# Patient Record
Sex: Female | Born: 1990 | Race: Black or African American | Hispanic: No | Marital: Single | Smoking: Never smoker
Health system: Southern US, Community
[De-identification: ages and names within clinical notes are randomized; demographics above are authoritative.]

---

## 2012-12-07 ENCOUNTER — Emergency Department (HOSPITAL_COMMUNITY)
Admission: EM | Admit: 2012-12-07 | Discharge: 2012-12-07 | Disposition: A | Discharge status: Home / Self Care | Payer: PRIVATE HEALTH INSURANCE | Attending: Emergency Medicine | Admitting: Emergency Medicine

## 2012-12-07 ENCOUNTER — Encounter (HOSPITAL_COMMUNITY): Payer: Self-pay | Admitting: Nurse Practitioner

## 2012-12-07 DIAGNOSIS — B3731 Acute candidiasis of vulva and vagina: Secondary | ICD-10-CM | POA: Insufficient documentation

## 2012-12-07 DIAGNOSIS — N898 Other specified noninflammatory disorders of vagina: Secondary | ICD-10-CM | POA: Insufficient documentation

## 2012-12-07 DIAGNOSIS — B373 Candidiasis of vulva and vagina: Secondary | ICD-10-CM

## 2012-12-07 LAB — WET PREP, GENITAL
Clue Cells Wet Prep HPF POC: NONE SEEN
Trich, Wet Prep: NONE SEEN

## 2012-12-07 MED ORDER — FLUCONAZOLE 150 MG PO TABS: 150.0000 mg | ORAL_TABLET | Freq: Once | ORAL | Status: DC

## 2012-12-07 NOTE — ED Provider Notes (Signed)
CSN: [id number]     Arrival date & time 12/07/12  1855 History   First MD Initiated Contact with Patient 12/07/12 1912     Chief Complaint  Patient presents with  . Vaginal Itching   (Consider location/radiation/quality/duration/timing/severity/associated sxs/prior Treatment) Patient is a 22 y.o. female presenting with vaginal itching. The history is provided by the patient and medical records. No language interpreter was used.  Vaginal Itching Pertinent negatives include no abdominal pain, chest pain, coughing, diaphoresis, fatigue, fever, headaches, nausea, rash or vomiting.    Kiara Shaw is a 22 y.o. female  with no medical history presents to the Emergency Department complaining of gradual, persistent, progressively worsening vaginal itching onset several weeks ago. 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 She reports itching of the labia without vaginal discharge.  She reports that the skin around her vagina burns when she takes a shower.   She denies history of sexual activity, STD.  Associated symptoms include severe itching.  Patient has not tried any over-the-counter medications. Nothing makes it better and scratching and showers makes it worse.  Pt denies fever, chills, headache neck pain, chest pain or shortness of breath no abdominal pain nausea, vomiting, diarrhea, weakness and dizziness, dysuria, hematuria, vaginal discharge, vaginal bleeding.     History reviewed. No pertinent past medical history. History reviewed. No pertinent past surgical history. History reviewed. No pertinent family history. History  Substance Use Topics  . Smoking status: Never Smoker   . Smokeless tobacco: Not on file  . Alcohol Use: No   OB History   Grav Para Term Preterm Abortions TAB SAB Ect Mult Living                 Review of Systems  Constitutional: Negative for fever, diaphoresis, appetite change, fatigue and unexpected weight  change.  HENT: Negative for mouth sores and neck stiffness.   Eyes: Negative for visual disturbance.  Respiratory: Negative for cough, chest tightness, shortness of breath and wheezing.   Cardiovascular: Negative for chest pain.  Gastrointestinal: Negative for nausea, vomiting, abdominal pain, diarrhea and constipation.  Endocrine: Negative for polydipsia, polyphagia and polyuria.  Genitourinary: Negative for dysuria, urgency, frequency and hematuria.       Vaginal itching  Musculoskeletal: Negative for back pain.  Skin: Negative for rash.  Allergic/Immunologic: Negative for immunocompromised state.  Neurological: Negative for syncope, light-headedness and headaches.  Hematological: Does not bruise/bleed easily.  Psychiatric/Behavioral: Negative for sleep disturbance. The patient is not nervous/anxious.     Allergies  Cefotaxime  Home Medications   Current Outpatient Rx  Name  Route  Sig  Dispense  Refill  . fluconazole (DIFLUCAN) 150 MG tablet   Oral   Take 1 tablet (150 mg total) by mouth once.   1 tablet   0    BP 122/77  Pulse 96  Temp(Src) 98.4 F (36.9 C) (Oral)  Resp 16  SpO2 96% Physical Exam  Nursing note and vitals reviewed. Constitutional: She appears well-developed and well-nourished. No distress.  Awake, alert, nontoxic appearance  HENT:  Head: Normocephalic and atraumatic.  Mouth/Throat: Oropharynx is clear and moist. No oropharyngeal exudate.  Eyes: Conjunctivae are normal. No scleral icterus.  Neck: Normal range of motion. Neck supple.  Cardiovascular: Normal rate, regular rhythm, normal heart sounds and intact distal pulses.   No murmur heard. Pulmonary/Chest: Effort normal and breath sounds normal. No respiratory distress. She has no wheezes.  Abdominal: Soft. Bowel sounds are normal.  She exhibits no distension. There is no hepatosplenomegaly. There is no tenderness. There is no rebound and no CVA tenderness. Hernia confirmed negative in the right  inguinal area and confirmed negative in the left inguinal area.  Soft and nontender  Genitourinary: Uterus normal. Pelvic exam was performed with patient supine. There is lesion on the right labia. There is no rash, tenderness or injury on the right labia. There is lesion on the left labia. There is no rash, tenderness or injury on the left labia. Uterus is not tender. Cervix exhibits no motion tenderness and no friability. Right adnexum displays no mass and no tenderness. Left adnexum displays no mass and no tenderness. No erythema, tenderness or bleeding around the vagina. No foreign body around the vagina. No signs of injury around the vagina. Vaginal discharge ( White, thick, copious) found.  Excoriations to the labia bilaterally; mild erythema without induration or fluctuance; no evidence of abscess Copious thick vaginal discharge  Musculoskeletal: Normal range of motion. She exhibits no edema.  Lymphadenopathy:    She has no cervical adenopathy.       Right: No inguinal adenopathy present.       Left: No inguinal adenopathy present.  Neurological: She is alert.  Speech is clear and goal oriented Moves extremities without ataxia  Skin: Skin is warm and dry. No rash noted. She is not diaphoretic.  Psychiatric: She has a normal mood and affect. Her behavior is normal.    ED Course  Procedures (including critical care time) Labs Review Labs Reviewed  WET PREP, GENITAL - Abnormal; Notable for the following:    Yeast Wet Prep HPF POC FEW (*)    WBC, Wet Prep HPF POC FEW (*)    All other components within normal limits   Imaging Review No results found.  MDM   1. Vaginal yeast infection      Kiara Shaw presents with vaginal itching and discharge on physical exam.  History and physical consistent with Candida infection. Will obtain wet prep. No dysuria, suprapubic abdominal pain, hematuria or evidence of urinary tract infection.  8:41 PM Wet prep with yeast; pt with candidal  vulvovaginitis.  Will d/c with Rx for diflucan and recommend f/u with ob/gyn for further evaluation.    It has been determined that no acute conditions requiring further emergency intervention are present at this time. The patient/guardian have been advised of the diagnosis and plan. We have discussed signs and symptoms that warrant return to the ED, such as changes or worsening in symptoms.   Vital signs are stable at discharge.   BP 122/77  Pulse 96  Temp(Src) 98.4 F (36.9 C) (Oral)  Resp 16  SpO2 96%  Patient/guardian has voiced understanding and agreed to follow-up with the PCP or specialist.         Dierdre Forth, PA-C 12/07/12 2043

## 2012-12-07 NOTE — ED Notes (Signed)
Pt reports vaginal itching and pain since Thursday. No vaginal discharge. No abd cramping, bowel/bladder changes. Hurts to take a shower

## 2012-12-08 NOTE — ED Provider Notes (Signed)
Medical screening examination/treatment/procedure(s) were performed by non-physician practitioner and as supervising physician I was immediately available for consultation/collaboration.   Thresa Dozier W. Journey Castonguay, MD 12/08/12 1225 

## 2013-04-03 ENCOUNTER — Emergency Department (HOSPITAL_COMMUNITY)
Admission: EM | Admit: 2013-04-03 | Discharge: 2013-04-03 | Disposition: A | Discharge status: Home / Self Care | Payer: PRIVATE HEALTH INSURANCE | Attending: Emergency Medicine | Admitting: Emergency Medicine

## 2013-04-03 ENCOUNTER — Encounter (HOSPITAL_COMMUNITY): Payer: Self-pay | Admitting: Emergency Medicine

## 2013-04-03 DIAGNOSIS — H6120 Impacted cerumen, unspecified ear: Secondary | ICD-10-CM | POA: Insufficient documentation

## 2013-04-03 DIAGNOSIS — Z881 Allergy status to other antibiotic agents status: Secondary | ICD-10-CM | POA: Insufficient documentation

## 2013-04-03 DIAGNOSIS — H6690 Otitis media, unspecified, unspecified ear: Secondary | ICD-10-CM | POA: Insufficient documentation

## 2013-04-03 DIAGNOSIS — H6691 Otitis media, unspecified, right ear: Secondary | ICD-10-CM

## 2013-04-03 DIAGNOSIS — H9209 Otalgia, unspecified ear: Secondary | ICD-10-CM

## 2013-04-03 MED ORDER — ERYTHROMYCIN BASE 250 MG PO TABS: 500.0000 mg | ORAL_TABLET | Freq: Two times a day (BID) | ORAL | Status: DC

## 2013-04-03 MED ORDER — AMOXICILLIN 500 MG PO CAPS: 500.0000 mg | ORAL_CAPSULE | Freq: Two times a day (BID) | ORAL | Status: DC

## 2013-04-03 NOTE — ED Provider Notes (Addendum)
CSN: [id number]     Arrival date & time 04/03/13  1459 History   This chart was scribed for non-physician practitioner Antony MaduraKelly Burdett Pinzon, PA-C working with Hurman HornJohn M Bednar, MD by Donne Anonayla Curran, ED Scribe. This patient was seen in room TR07C/TR07C and the patient's care was started at 1645.   First MD Initiated Contact with Patient 04/03/13 1645     Chief Complaint  Patient presents with  . Otalgia    The history is provided by the patient. No language interpreter was used.   HPI Comments: Kiara Shaw is a 23 y.o. female who presents to the Emergency Department complaining of 3 days of gradual onset, constant, non changing, moderate right sided otalgia described as aching. She denies ear drainage, swelling of the outer ear, decreased hearing, fever, nasal congestion, rhinorrhea, nasal congestion, inability to swallow, drooling, neck pain, neck stiffness or any other symptoms. She denies a hx of chronic ear infections and has never had tubes in her ears.   History reviewed. No pertinent past medical history. History reviewed. No pertinent past surgical history. No family history on file. History  Substance Use Topics  . Smoking status: Never Smoker   . Smokeless tobacco: Not on file  . Alcohol Use: No   OB History   Grav Para Term Preterm Abortions TAB SAB Ect Mult Living                 Review of Systems  Constitutional: Negative for fever.  HENT: Positive for ear pain. Negative for congestion, drooling, ear discharge, hearing loss, rhinorrhea and trouble swallowing.   Musculoskeletal: Negative for neck pain and neck stiffness.  All other systems reviewed and are negative.    Allergies  Cefotaxime  Home Medications   Current Outpatient Rx  Name  Route  Sig  Dispense  Refill  . amoxicillin (AMOXIL) 500 MG capsule   Oral   Take 1 capsule (500 mg total) by mouth 2 (two) times daily.   20 capsule   0     BP 130/80  Pulse 80  Temp(Src) 98.8 F (37.1 C) (Oral)  Resp 18   Wt 225 lb (102.059 kg)  SpO2 98%  LMP 03/15/2013  Physical Exam  Nursing note and vitals reviewed. Constitutional: She is oriented to person, place, and time. She appears well-developed and well-nourished. No distress.  HENT:  Head: Normocephalic and atraumatic.  Right Ear: Hearing and external ear normal. No drainage or tenderness. No mastoid tenderness. No decreased hearing is noted.  Left Ear: Hearing, tympanic membrane, external ear and ear canal normal. No drainage. No mastoid tenderness. No decreased hearing is noted.  Nose: Nose normal.  Mouth/Throat: Uvula is midline, oropharynx is clear and moist and mucous membranes are normal.  +cerumen impaction of R ear; unable to visualize posterior canal and TM.   Eyes: Conjunctivae and EOM are normal. No scleral icterus.  Neck: Normal range of motion.  Pulmonary/Chest: Effort normal. No respiratory distress.  Musculoskeletal: Normal range of motion.  Neurological: She is alert and oriented to person, place, and time.  Skin: Skin is warm and dry. No rash noted. She is not diaphoretic. No erythema. No pallor.  Psychiatric: She has a normal mood and affect. Her behavior is normal.    ED Course  FOREIGN BODY REMOVAL Date/Time: 05/07/2013 7:25 AM Performed by: Antony MaduraHUMES, Reeder Brisby Authorized by: Antony MaduraHUMES, Cali Cuartas Consent: Verbal consent obtained. written consent not obtained. The procedure was performed in an emergent situation. Risks and benefits: risks, benefits and alternatives  were discussed Consent given by: patient Patient understanding: patient states understanding of the procedure being performed Patient consent: the patient's understanding of the procedure matches consent given Procedure consent: procedure consent matches procedure scheduled Relevant documents: relevant documents present and verified Test results: test results available and properly labeled Site marked: the operative site was marked Imaging studies: imaging studies  available Required items: required blood products, implants, devices, and special equipment available Patient identity confirmed: verbally with patient and arm band Time out: Immediately prior to procedure a "time out" was called to verify the correct patient, procedure, equipment, support staff and site/side marked as required. Body area: ear Location details: right ear Patient sedated: no Patient restrained: no Patient cooperative: yes Localization method: visualized Removal mechanism: irrigation Complexity: simple 1 objects recovered. Objects recovered: cerumen Post-procedure assessment: foreign body removed Patient tolerance: Patient tolerated the procedure well with no immediate complications.   (including critical care time) DIAGNOSTIC STUDIES: Oxygen Saturation is 98% on RA, normal by my interpretation.    COORDINATION OF CARE: 4:55 PM Discussed treatment plan which includes water flush of right ear with pt at bedside and pt agreed to plan.   6:17 PM Rechecked pt. Visualized TM. Will discharge home with antibiotic. Advised pt to follow up with PCP.   Labs Review Labs Reviewed - No data to display Imaging Review No results found.  EKG Interpretation   None       MDM   1. Otitis media of right ear   2. Otalgia    23 year old female presents for right-sided otalgia. Physical exam without any decreased hearing or mastoid swelling, erythema, or tenderness bilaterally. No nuchal rigidity or meningismus. No trismus. Patient found to have impacted cerumen in right ear canal. Cerumen successfully removed and ear reexamined. Posterior ear canal erythematous with erythematous and dull tympanic membrane. No bulging, retraction, or perforation. Will cover patient for otitis media with course of oral antibiotics. Return precautions discussed and patient agreeable to plan with no unaddressed concerns.  I personally performed the services described in this documentation, which was  scribed in my presence. The recorded information has been reviewed and is accurate.     Antony Madura, PA-C 04/03/13 1830  Antony Madura, PA-C 05/07/13 [phone]

## 2013-04-03 NOTE — Discharge Instructions (Signed)
Take the antibiotics as prescribed to for the full course. Take ibuprofen 600 mg every 6 hours as needed for discomfort. Follow up with your primary care doctor. Return if symptoms worsen.  Otitis Media, Adult Otitis media is redness, soreness, and swelling (inflammation) of the middle ear. Otitis media may be caused by allergies or, most commonly, by infection. Often it occurs as a complication of the common cold. SIGNS AND SYMPTOMS Symptoms of otitis media may include:  Earache.  Fever.  Ringing in your ear.  Headache.  Leakage of fluid from the ear. DIAGNOSIS To diagnose otitis media, your health care provider will examine your ear with an otoscope. This is an instrument that allows your health care provider to see into your ear in order to examine your eardrum. Your health care provider also will ask you questions about your symptoms. TREATMENT  Typically, otitis media resolves on its own within 3 5 days. Your health care provider may prescribe medicine to ease your symptoms of pain. If otitis media does not resolve within 5 days or is recurrent, your health care provider may prescribe antibiotic medicines if he or she suspects that a bacterial infection is the cause. HOME CARE INSTRUCTIONS   Take your medicine as directed until it is gone, even if you feel better after the first few days.  Only take over-the-counter or prescription medicines for pain, discomfort, or fever as directed by your health care provider.  Follow up with your health care provider as directed. SEEK MEDICAL CARE IF:  You have otitis media only in one ear or bleeding from your nose or both.  You notice a lump on your neck.  You are not getting better in 3 5 days.  You feel worse instead of better. SEEK IMMEDIATE MEDICAL CARE IF:   You have pain that is not controlled with medicine.  You have swelling, redness, or pain around your ear or stiffness in your neck.  You notice that part of your face is  paralyzed.  You notice that the bone behind your ear (mastoid) is tender when you touch it. MAKE SURE YOU:   Understand these instructions.  Will watch your condition.  Will get help right away if you are not doing well or get worse. Document Released: 12/06/2003 Document Revised: 12/21/2012 Document Reviewed: 09/27/2012 [facility] Patient Information 2014 [place], [place].

## 2013-04-03 NOTE — ED Notes (Signed)
Pt is here with right ear pain

## 2013-04-03 NOTE — ED Notes (Signed)
Ear waxed removed and uneventfull.

## 2013-04-03 NOTE — ED Notes (Signed)
Pt discharged.Vital signs stable and GCS 15 

## 2013-04-06 NOTE — ED Provider Notes (Signed)
Medical screening examination/treatment/procedure(s) were performed by non-physician practitioner and as supervising physician I was immediately available for consultation/collaboration.  Theresa Wedel M Neftali Abair, MD 04/06/13 1854 

## 2013-05-08 NOTE — ED Provider Notes (Signed)
Medical screening examination/treatment/procedure(s) were performed by non-physician practitioner and as supervising physician I was immediately available for consultation/collaboration.  Hurman HornJohn M Izabellah Dadisman, MD 05/08/13 [phone]

## 2013-06-11 ENCOUNTER — Emergency Department (HOSPITAL_COMMUNITY)
Admission: EM | Admit: 2013-06-11 | Discharge: 2013-06-11 | Disposition: A | Discharge status: Home / Self Care | Payer: PRIVATE HEALTH INSURANCE | Attending: Emergency Medicine | Admitting: Emergency Medicine

## 2013-06-11 ENCOUNTER — Encounter (HOSPITAL_COMMUNITY): Payer: Self-pay | Admitting: Emergency Medicine

## 2013-06-11 DIAGNOSIS — Z792 Long term (current) use of antibiotics: Secondary | ICD-10-CM | POA: Insufficient documentation

## 2013-06-11 DIAGNOSIS — B360 Pityriasis versicolor: Secondary | ICD-10-CM | POA: Insufficient documentation

## 2013-06-11 MED ORDER — CLOTRIMAZOLE-BETAMETHASONE 1-0.05 % EX CREA: 1.0000 "application " | TOPICAL_CREAM | Freq: Two times a day (BID) | CUTANEOUS | Status: DC

## 2013-06-11 NOTE — ED Provider Notes (Signed)
CSN: [id number]     Arrival date & time 06/11/13  1542 History  This chart was scribed for non-physician practitioner, Dierdre Forth, PA-C,working with Geoffery Lyons, MD, by Karle Plumber, ED Scribe.  This patient was seen in room TR04C/TR04C and the patient's care was started at 4:07 PM.  Chief Complaint  Patient presents with  . Rash   The history is provided by the patient and medical records. No language interpreter was used.   HPI Comments:  Kiara Shaw is a 23 y.o. female who presents to the Emergency Department complaining of an itchy rash that started approximately two weeks ago. She states the rash starts behind her ears and goes across under her chin, extending down to her chest. She reports the rash has also extended down on to her lateral left-sided torso and her upper left shoulder towards the back. She denies any rash on her arms or legs. She denies any new creams, lotions, cosmetics or detergents. She denies any recent travel. She reports using OTC hydrocortisone creams that have relieved the itch. She denies taking anything orally for her symptoms. She denies fever, chills, nausea, vomiting, or swelling.    History reviewed. No pertinent past medical history. History reviewed. No pertinent past surgical history. History reviewed. No pertinent family history. History  Substance Use Topics  . Smoking status: Never Smoker   . Smokeless tobacco: Not on file  . Alcohol Use: No   OB History   Grav Para Term Preterm Abortions TAB SAB Ect Mult Living                 Review of Systems  Constitutional: Negative for fever and chills.  Eyes: Negative for visual disturbance.  Respiratory: Negative for cough.   Gastrointestinal: Negative for nausea and vomiting.  Endocrine: Negative for polydipsia, polyphagia and polyuria.  Skin: Positive for rash.  Allergic/Immunologic: Negative for immunocompromised state.  Neurological: Negative for headaches.  Hematological: Does  not bruise/bleed easily.    Allergies  Cefotaxime  Home Medications   Current Outpatient Rx  Name  Route  Sig  Dispense  Refill  . clotrimazole-betamethasone (LOTRISONE) cream   Topical   Apply 1 application topically 2 (two) times daily.   30 g   0   . erythromycin (E-MYCIN) 250 MG tablet   Oral   Take 2 tablets (500 mg total) by mouth 2 (two) times daily.   40 tablet   0    Triage Vitals: BP 141/80  Pulse 100  Temp(Src) 98.4 F (36.9 C) (Oral)  Resp 18  SpO2 98%  LMP 05/13/2013 Physical Exam  Nursing note and vitals reviewed. Constitutional: She appears well-developed and well-nourished. No distress.  Awake, alert, nontoxic appearance  HENT:  Head: Normocephalic and atraumatic.  Mouth/Throat: Oropharynx is clear and moist. No oropharyngeal exudate, posterior oropharyngeal edema or posterior oropharyngeal erythema.  Eyes: Conjunctivae are normal. No scleral icterus.  Neck: Normal range of motion. Neck supple.  Cardiovascular: Normal rate, regular rhythm, normal heart sounds and intact distal pulses.   Pulmonary/Chest: Effort normal and breath sounds normal.  Clear and equal without wheezing  Musculoskeletal: Normal range of motion. She exhibits no edema.  Neurological: She is alert.  Speech is clear and goal oriented Moves extremities without ataxia  Skin: Skin is warm and dry. Rash noted. She is not diaphoretic.  Oval, erythematous scaling patches along anteror and bilateral neck, right anterior chest, left posterior shoulder and right flank. No Christmas tree pattern on back. No patches on  arms or legs. Rash is mildly excoriated. No petechiae or purpura.  Psychiatric: She has a normal mood and affect.    ED Course  Procedures (including critical care time) DIAGNOSTIC STUDIES: Oxygen Saturation is 98% on RA, normal by my interpretation.   COORDINATION OF CARE: 4:12 PM- Will refer to dermatology. Pt verbalizes understanding and agrees to plan.  Medications  - No data to display  Labs Review Labs Reviewed - No data to display Imaging Review No results found.   EKG Interpretation None      MDM   Final diagnoses:  Tinea versicolor   Kiara Shaw presents with 2 weeks of rash.  NO urticaria or hive to indicate allergic reaction.  Oropharynx clear and no wheezing.  Rash likely fungal in nature.  No petechiae or purpura to suggest systemic disease.  Afebrile, non tachycardic and nonseptic appearing. Will give lotrisone and have her follow-up with dermatology.  It has been determined that no acute conditions requiring further emergency intervention are present at this time. The patient/guardian have been advised of the diagnosis and plan. We have discussed signs and symptoms that warrant return to the ED, such as changes or worsening in symptoms.   Vital signs are stable at discharge.   BP 141/80  Pulse 100  Temp(Src) 98.4 F (36.9 C) (Oral)  Resp 18  SpO2 98%  LMP 05/13/2013  Patient/guardian has voiced understanding and agreed to follow-up with the PCP or specialist.     I personally performed the services described in this documentation, which was scribed in my presence. The recorded information has been reviewed and is accurate.    Dahlia ClientHannah Ximenna Fonseca, PA-C 06/11/13 [phone]

## 2013-06-11 NOTE — Discharge Instructions (Signed)
1. Medications: lotrisone, usual home medications 2. Treatment: rest, drink plenty of fluids, keep clean and dry 3. Follow Up: Please followup with your primary doctor for discussion of your diagnoses and further evaluation after today's visit; if you do not have a primary care doctor use the resource guide provided to find one; follow-up with dermatology    Tinea Versicolor Tinea versicolor is a common yeast infection of the skin. This condition becomes known when the yeast on our skin starts to overgrow (yeast is a normal inhabitant on our skin). This condition is noticed as white or light brown patches on brown skin, and is more evident in the summer on tanned skin. These areas are slightly scaly if scratched. The light patches from the yeast become evident when the yeast creates "holes in your suntan". This is most often noticed in the summer. The patches are usually located on the chest, back, pubis, neck and body folds. However, it may occur on any area of body. Mild itching and inflammation (redness or soreness) may be present. DIAGNOSIS  The diagnosisof this is made clinically (by looking). Cultures from samples are usually not needed. Examination under the microscope may help. However, yeast is normally found on skin. The diagnosis still remains clinical. Examination under Wood's Ultraviolet Light can determine the extent of the infection. TREATMENT  This common infection is usually only of cosmetic (only a concern to your appearance). It is easily treated with dandruff shampoo used during showers or bathing. Vigorous scrubbing will eliminate the yeast over several days time. The light areas in your skin may remain for weeks or months after the infection is cured unless your skin is exposed to sunlight. The lighter or darker spots caused by the fungus that remain after complete treatment are not a sign of treatment failure; it will take a long time to resolve. Your caregiver may recommend a  number of commercial preparations or medication by mouth if home care is not working. Recurrence is common and preventative medication may be necessary. This skin condition is not highly contagious. Special care is not needed to protect close friends and family members. Normal hygiene is usually enough. Follow up is required only if you develop complications (such as a secondary infection from scratching), if recommended by your caregiver, or if no relief is obtained from the preparations used. Document Released: 02/28/2000 Document Revised: 05/25/2011 Document Reviewed: 04/11/2008 [facility] Patient Information 2014 [place], [place].    Emergency Department Resource Guide 1) Find a Doctor and Pay Out of Pocket Although you won't have to find out who is covered by your insurance plan, it is a good idea to ask around and get recommendations. You will then need to call the office and see if the doctor you have chosen will accept you as a new patient and what types of options they offer for patients who are self-pay. Some doctors offer discounts or will set up payment plans for their patients who do not have insurance, but you will need to ask so you aren't surprised when you get to your appointment.  2) Contact Your Local Health Department Not all health departments have doctors that can see patients for sick visits, but many do, so it is worth a call to see if yours does. If you don't know where your local health department is, you can check in your phone book. The CDC also has a tool to help you locate your state's health department, and many state websites also have listings of  all of their local health departments.  3) Find a [facility] If your illness is not likely to be very severe or complicated, you may want to try a walk in clinic. These are popping up all over the country in pharmacies, drugstores, and shopping centers. They're usually staffed by nurse practitioners or physician assistants  that have been trained to treat common illnesses and complaints. They're usually fairly quick and inexpensive. However, if you have serious medical issues or chronic medical problems, these are probably not your best option.  No Primary Care Doctor: - Call Health Connect at  [phone] - they can help you locate a primary care doctor that  accepts your insurance, provides certain services, etc. - Physician Referral Service- [phone]  Chronic Pain Problems: Organization         Address  Phone   Notes  [facility]  [phone] Patients need to be referred by their primary care doctor.   Medication Assistance: Organization         Address  Phone   Notes  [facility] Medication [facility] [address] [place], [place] [address] [phone] --Must be a resident of [facility] -- Must have NO insurance coverage whatsoever (no Medicaid/ Medicare, etc.) -- The pt. MUST have a primary care doctor that directs their care regularly and follows them in the community   MedAssist  [phone]   [employer]  [phone]    Agencies that provide inexpensive medical care: Organization         Address  Phone   Notes  [place]  [phone]   [facility]    [phone]   [facility] [place], [place] [address] [phone]   [place] [address], [place] [phone]   Planned Parenthood    [phone]   [facility]    [phone]   [place] and [place] [address], [place] Phone:  [phone], Fax:  [phone] Hours of Operation:  9 am - 6 pm, M-F.  Also accepts Medicaid/Medicare and self-pay.  [facility] for [place], [address], [place] Phone: [phone], Fax: [phone]. Hours of Operation:  8:30 am - 5:30 pm, M-F.  Also accepts Medicaid  and self-pay.  [facility] [address] [place] Phone: [phone]   [place], [place], [place] [phone], Ext. [phone] Mondays & Thursdays: 7-9 AM.  First 15 patients are seen on a first come, first serve basis.    [place] Providers:  Organization         Address  Phone   Notes  [facility] [address], [place] [phone] Also accepts self-pay patients.  [facility] [address], [place]  [phone]   [place], [address], [place] [phone]   [facility] [address], [place] [phone]   Lucianne Lei [address], [place]   [phone] Only accepts [place] Access [place] patients after they have their name applied to their card.   Self-Pay (no insurance) in [facility]:  Patent attorney   Notes  Sickle Cell Patients, [facility] [place] [phone]   [facility] [place] [phone]   [facility] [place]  [place], [address],  [phone]   [facility]/Dr. Osei-Bonsu  [address], [place] or [address], [place] [phone] Phone number for both [place] and [place] locations is the same.  Urgent Medical and [facility] [address] [place] [phone]   [facility] [address] [place] or [address] [phone]   [facility] [address], [place] [phone], phone; [phone], fax Sees patients 1st and 3rd Saturday of every month.  Must not qualify for public or private insurance (i.e. Medicaid, Medicare, [place] Health Choice, Veterans' Benefits)  Household income  should be no more than 200% of the poverty level The clinic cannot treat you if you are pregnant or think you are pregnant  Sexually transmitted diseases are not treated at the clinic.    Dental Care: Organization         Address  Phone  Notes  [facility] Department of [facility] [place] [phone] Accepts children up to age 69 who are enrolled in [place] or [place]; pregnant women with a Medicaid card; and children who have applied for Medicaid or [place] Health Choice, but were declined, whose parents can pay a reduced fee at time of service.  [facility] Department of [facility]  [address], [place] [phone] Accepts children up to age 54 who are enrolled in [place] or [place]; pregnant women with a Medicaid card; and children who have applied for Medicaid or [place] Health Choice, but were declined, whose parents can pay a reduced fee at time of service.  [facility] Adult [facility] Access PROGRAM  [place] [phone] Patients are seen by appointment only. Walk-ins are not accepted. [place] will see patients 66 years of age and older. Monday - Tuesday (8am-5pm) Most Wednesdays (8:30-5pm) $30 per visit, cash only  [facility] Adult Dental Access PROGRAM  [address], [facility] [phone] Patients are seen by appointment only. Walk-ins are not accepted. [place] will see patients 62 years of age and older. One Wednesday Evening (Monthly: Volunteer Based).  $30 per visit, cash only  [place]  [phone] for adults; Children under age 26, call Graduate Pediatric Dentistry at [phone]. Children aged 100-14, please call [phone] to request a pediatric application.  Dental services are provided in all areas of dental care including fillings, crowns and bridges, complete and partial dentures, implants, gum treatment,  root canals, and extractions. Preventive care is also provided. Treatment is provided to both adults and children. Patients are selected via a lottery and there is often a waiting list.   [facility] [address] [place]  [phone] [email or web address]   [facility] [address] [place], [place] [phone], Ext. [phone] Second and Fourth Thursday of each month, opens at 6:30 AM; Clinic ends at 9 AM.  Patients are seen on a first-come first-served basis, and a limited number are seen during each clinic.   [facility]  [address] Hillard Danker [place], [place] [phone]   Eligibility  Requirements You must have lived in [place], [place], or [place] counties for at least the last three months.   You cannot be eligible for state or federal sponsored [employer], including [employer], [place], or [employer].   You generally cannot be eligible for healthcare insurance through your employer.    How to apply: Eligibility screenings are held every Tuesday and Wednesday afternoon from 1:00 pm until 4:00 pm. You do not need an appointment for the interview!  [facility] [address], [place], [place]   [place]  [place] Department  [address]  [phone]    Behavioral Health Resources in the Community: Intensive Outpatient Programs Organization         Address  Phone  Notes  [place]. [address], [place], [place] [phone]   [facility] Outpatient [address] [place], [place]   ADS: Alcohol & Drug S[facility]cs [address] [place], [place]   [place] [address]  [place], [place] or [phone]   Substance Abuse Resources Organization         Address  Phone  Notes  Alcohol and Drug Services   [phone]   [place]  [phone]   [facility] [place]   Chinita Pester  [phone]   Residential & Outpatient Substance Abuse Program  [phone]   Psychological Services Organization         Address  Phone  Notes  [facility] [place]  [place]  [phone]   [place] [address] [place] or [phone]    Mobile Crisis Teams Organization         Address  Phone  Notes  [facility], Mobile Crisis Care Unit  [phone]   [facility]ive Psychotherapeutic Services  [address] [place], [place]   [place] [address], [place] [phone]    Self-Help/Support Groups Organization         Address  Phone             Notes  [place]. of [place] - variety of support groups  [place] Call for more information  Narcotics Anonymous (NA), [facility] [address], [employer] [place]  2 meetings at this location   Special educational needs teacher         Address  Phone  Notes  ASAP Residential Treatment [place],    [place]  [phone]   [facility]  [address], [place] [address], [place], [place]   [place], [place] [phone] Admissions: 8am-3pm M-F  Incentives Substance [place] [address]    [place], [place] [phone]   [facility] [address] Jadene Pierini [place], [place]   [facility] [address],  [place], [place]   [facility] Programs - Intensive Outpatient [address] Krist[address], [place], [place]   [facility].,  [place], [place] or [phone]   [facility] Services (RTS) [address] [place], [place] Accepts Medicaid  [facility] [address],  [place] [phone] Substance Abuse/Addiction Treatment   [facility] Resources Organization  Address  Phone  Notes  [facility]  [phone]   Angie FavaJulie Brannon, PhD [address], [address] [place], [place]   [phone] or [phone]) [phone]   [facility] [place]   [address] [place], [place] [phone]) [phone]   [facility] [address], [place], [place] [phone] Insurance/Medicaid/sponsorship through [facility]  [facility] Families [address]                                    [place], [place] [phone] Therapy/tele-psych/case  [facility] [address]   [place], [place] [phone]    Dr. Lolly MustacheArfeen  [phone](336) [phone]   [facility] [place]  [employer] [facility] 1) [address], [place] 2) [address], [place] 3)  [address], [place] [phone]  [phone]) [phone]   [facility] Child Abuse Hotline [phone]) [phone] or [phone]) [phone] (After Hours)

## 2013-06-11 NOTE — ED Provider Notes (Signed)
Medical screening examination/treatment/procedure(s) were conducted as a shared visit with non-physician practitioner(s) and myself.  I personally evaluated the patient during the encounter. Patient is a 23 year old female who presents with rash on the back of her head and neck extending under her chin. This is itchy and has been there for approximately 1-2 week. She denies any fevers or chills. She denies any new contacts.  On exam vitals are stable and the patient is afebrile. Head is atraumatic normocephalic neck is supple. Exam of the skin reveals a blotchy, pruritic rash to the neck.   This appears fungal in nature. This will be treated with Lotrisone and when necessary followup.     Geoffery Lyonsouglas Vincente Asbridge, MD 06/11/13 [phone]

## 2013-06-11 NOTE — ED Notes (Signed)
Reports having a rash and itching x 2 weeks to face and left side. No relief with hydrocortisone cream, has not used any benadryl. No acute distress noted.

## 2013-12-29 ENCOUNTER — Encounter (HOSPITAL_COMMUNITY): Payer: Self-pay | Admitting: Emergency Medicine

## 2013-12-29 DIAGNOSIS — R531 Weakness: Secondary | ICD-10-CM | POA: Insufficient documentation

## 2013-12-29 DIAGNOSIS — R42 Dizziness and giddiness: Secondary | ICD-10-CM | POA: Diagnosis not present

## 2013-12-29 DIAGNOSIS — Z3202 Encounter for pregnancy test, result negative: Secondary | ICD-10-CM | POA: Diagnosis not present

## 2013-12-29 DIAGNOSIS — E669 Obesity, unspecified: Secondary | ICD-10-CM | POA: Diagnosis not present

## 2013-12-29 DIAGNOSIS — N939 Abnormal uterine and vaginal bleeding, unspecified: Secondary | ICD-10-CM | POA: Insufficient documentation

## 2013-12-29 LAB — COMPREHENSIVE METABOLIC PANEL
ALK PHOS: 102 U/L (ref 39–117)
ALT: 19 U/L (ref 0–35)
ANION GAP: 11 (ref 5–15)
AST: 25 U/L (ref 0–37)
Albumin: 3.6 g/dL (ref 3.5–5.2)
BUN: 6 mg/dL (ref 6–23)
CHLORIDE: 101 meq/L (ref 96–112)
CO2: 25 meq/L (ref 19–32)
CREATININE: 0.76 mg/dL (ref 0.50–1.10)
Calcium: 9.8 mg/dL (ref 8.4–10.5)
GLUCOSE: 144 mg/dL — AB (ref 70–99)
POTASSIUM: 3.7 meq/L (ref 3.7–5.3)
Sodium: 137 mEq/L (ref 137–147)
Total Protein: 8.1 g/dL (ref 6.0–8.3)

## 2013-12-29 LAB — CBC WITH DIFFERENTIAL/PLATELET
Basophils Absolute: 0 10*3/uL (ref 0.0–0.1)
Basophils Relative: 0 % (ref 0–1)
Eosinophils Absolute: 0.1 10*3/uL (ref 0.0–0.7)
Eosinophils Relative: 2 % (ref 0–5)
HCT: 35.3 % — ABNORMAL LOW (ref 36.0–46.0)
HEMOGLOBIN: 11.4 g/dL — AB (ref 12.0–15.0)
LYMPHS ABS: 1.5 10*3/uL (ref 0.7–4.0)
Lymphocytes Relative: 31 % (ref 12–46)
MCH: 25 pg — ABNORMAL LOW (ref 26.0–34.0)
MCHC: 32.3 g/dL (ref 30.0–36.0)
MCV: 77.4 fL — ABNORMAL LOW (ref 78.0–100.0)
MONOS PCT: 5 % (ref 3–12)
Monocytes Absolute: 0.2 10*3/uL (ref 0.1–1.0)
NEUTROS ABS: 2.9 10*3/uL (ref 1.7–7.7)
Neutrophils Relative %: 62 % (ref 43–77)
Platelets: 338 10*3/uL (ref 150–400)
RBC: 4.56 MIL/uL (ref 3.87–5.11)
RDW: 14.1 % (ref 11.5–15.5)
WBC: 4.7 10*3/uL (ref 4.0–10.5)

## 2013-12-29 LAB — URINE MICROSCOPIC-ADD ON

## 2013-12-29 LAB — URINALYSIS, ROUTINE W REFLEX MICROSCOPIC
Bilirubin Urine: NEGATIVE
Glucose, UA: NEGATIVE mg/dL
Ketones, ur: 15 mg/dL — AB
NITRITE: NEGATIVE
PROTEIN: NEGATIVE mg/dL
Specific Gravity, Urine: 1.031 — ABNORMAL HIGH (ref 1.005–1.030)
UROBILINOGEN UA: 1 mg/dL (ref 0.0–1.0)
pH: 6 (ref 5.0–8.0)

## 2013-12-29 LAB — PREGNANCY, URINE: Preg Test, Ur: NEGATIVE

## 2013-12-29 NOTE — ED Notes (Signed)
The pt last had a period in aug.  This period has lasted for 10 days.  She feels weak and lightheaded with a headache.  lmp now

## 2013-12-30 ENCOUNTER — Emergency Department (HOSPITAL_COMMUNITY)
Admission: EM | Admit: 2013-12-30 | Discharge: 2013-12-30 | Disposition: A | Discharge status: Home / Self Care | Payer: PRIVATE HEALTH INSURANCE | Attending: Emergency Medicine | Admitting: Emergency Medicine

## 2013-12-30 DIAGNOSIS — N939 Abnormal uterine and vaginal bleeding, unspecified: Secondary | ICD-10-CM

## 2013-12-30 LAB — RPR

## 2013-12-30 LAB — HIV ANTIBODY (ROUTINE TESTING W REFLEX): HIV 1&2 Ab, 4th Generation: NONREACTIVE

## 2013-12-30 MED ORDER — NAPROXEN 500 MG PO TABS: 500.0000 mg | ORAL_TABLET | Freq: Two times a day (BID) | ORAL | Status: DC

## 2013-12-30 NOTE — Discharge Instructions (Signed)
Abnormal Uterine Bleeding Abnormal uterine bleeding can affect women at various stages in life, including teenagers, women in their reproductive years, pregnant women, and women who have reached menopause. Several kinds of uterine bleeding are considered abnormal, including:  Bleeding or spotting between periods.   Bleeding after sexual intercourse.   Bleeding that is heavier or more than normal.   Periods that last longer than usual.  Bleeding after menopause.  Many cases of abnormal uterine bleeding are minor and simple to treat, while others are more serious. Any type of abnormal bleeding should be evaluated by your health care provider. Treatment will depend on the cause of the bleeding. HOME CARE INSTRUCTIONS Monitor your condition for any changes. The following actions may help to alleviate any discomfort you are experiencing:  Avoid the use of tampons and douches as directed by your health care provider.  Change your pads frequently. You should get regular pelvic exams and Pap tests. Keep all follow-up appointments for diagnostic tests as directed by your health care provider.  SEEK MEDICAL CARE IF:   Your bleeding lasts more than 1 week.   You feel dizzy at times.  SEEK IMMEDIATE MEDICAL CARE IF:   You pass out.   You are changing pads every 15 to 30 minutes.   You have abdominal pain.  You have a fever.   You become sweaty or weak.   You are passing large blood clots from the vagina.   You start to feel nauseous and vomit. MAKE SURE YOU:   Understand these instructions.  Will watch your condition.  Will get help right away if you are not doing well or get worse. Document Released: 03/02/2005 Document Revised: 03/07/2013 Document Reviewed: 09/29/2012 ExitCare Patient Information 2015 [employer]Care[employer]. This information is not intended to replace advice given to you by your health care provider. Make sure you discuss any questions you have with your  health care provider.  

## 2013-12-30 NOTE — ED Provider Notes (Signed)
CSN: [id number]     Arrival date & time 12/29/13  2229 History   First MD Initiated Contact with Patient 12/30/13 0151     Chief Complaint  Patient presents with  . Vaginal Bleeding    HPI Pt started having vaginal bleeding 11 days ago.  It is unusual for it to last this long.  She started to feel weak and lightheaded so she came to the ED.  The bleeding is heavy.  No fevers or chills.  NO vomiting.  No abdominal pain.  She does feel fatigued and nothing seems to help. History reviewed. No pertinent past medical history. History reviewed. No pertinent past surgical history. No family history on file. History  Substance Use Topics  . Smoking status: Never Smoker   . Smokeless tobacco: Not on file  . Alcohol Use: No   OB History   Grav Para Term Preterm Abortions TAB SAB Ect Mult Living                 Review of Systems  All other systems reviewed and are negative.     Allergies  Cefotaxime  Home Medications   Prior to Admission medications   Not on File   BP 140/82  Pulse 110  Temp(Src) 98.9 F (37.2 C) (Oral)  Resp 20  Ht 5\' 11"  (1.803 m)  Wt 250 lb (113.399 kg)  BMI 34.88 kg/m2  SpO2 99%  LMP 12/29/2013 Physical Exam  Nursing note and vitals reviewed. Constitutional: No distress.  Obese   HENT:  Head: Normocephalic and atraumatic.  Right Ear: External ear normal.  Left Ear: External ear normal.  Eyes: Conjunctivae are normal. Right eye exhibits no discharge. Left eye exhibits no discharge. No scleral icterus.  Neck: Neck supple. No tracheal deviation present.  Cardiovascular: Normal rate, regular rhythm and intact distal pulses.   Pulmonary/Chest: Effort normal and breath sounds normal. No stridor. No respiratory distress. She has no wheezes. She has no rales.  Abdominal: Soft. Bowel sounds are normal. She exhibits no distension. There is no tenderness. There is no rebound and no guarding.  Genitourinary: Cervix exhibits no motion tenderness. Right  adnexum displays no mass. Left adnexum displays mass. There is bleeding around the vagina. No tenderness around the vagina. No foreign body around the vagina. No signs of injury around the vagina.  Few clots in the vaginal vault   Musculoskeletal: She exhibits no edema and no tenderness.  Neurological: She is alert. She has normal strength. No cranial nerve deficit (no facial droop, extraocular movements intact, no slurred speech) or sensory deficit. She exhibits normal muscle tone. She displays no seizure activity. Coordination normal.  Skin: Skin is warm and dry. No rash noted.  Psychiatric: She has a normal mood and affect.    ED Course  Procedures (including critical care time) Labs Review Labs Reviewed  CBC WITH DIFFERENTIAL - Abnormal; Notable for the following:    Hemoglobin 11.4 (*)    HCT 35.3 (*)    MCV 77.4 (*)    MCH 25.0 (*)    All other components within normal limits  COMPREHENSIVE METABOLIC PANEL - Abnormal; Notable for the following:    Glucose, Bld 144 (*)    Total Bilirubin <0.2 (*)    All other components within normal limits  URINALYSIS, ROUTINE W REFLEX MICROSCOPIC - Abnormal; Notable for the following:    Color, Urine RED (*)    APPearance CLOUDY (*)    Specific Gravity, Urine 1.031 (*)  Hgb urine dipstick LARGE (*)    Ketones, ur 15 (*)    Leukocytes, UA TRACE (*)    All other components within normal limits  PREGNANCY, URINE  URINE MICROSCOPIC-ADD ON     MDM   Final diagnoses:  Abnormal uterine bleeding  Recc follow up with an OB GYN.  Cultures sent tonight.    At this time there does not appear to be any evidence of an acute emergency medical condition and the patient appears stable for discharge with appropriate outpatient follow up.     Linwood DibblesJon Mariano Doshi, MD 12/30/13 [phone]

## 2014-01-01 LAB — GC/CHLAMYDIA PROBE AMP
CT PROBE, AMP APTIMA: NEGATIVE
GC PROBE AMP APTIMA: NEGATIVE

## 2015-03-26 ENCOUNTER — Encounter (HOSPITAL_COMMUNITY): Payer: Self-pay | Admitting: Emergency Medicine

## 2015-03-26 DIAGNOSIS — B349 Viral infection, unspecified: Secondary | ICD-10-CM | POA: Diagnosis not present

## 2015-03-26 DIAGNOSIS — R6883 Chills (without fever): Secondary | ICD-10-CM | POA: Diagnosis present

## 2015-03-26 DIAGNOSIS — Z791 Long term (current) use of non-steroidal anti-inflammatories (NSAID): Secondary | ICD-10-CM | POA: Diagnosis not present

## 2015-03-26 LAB — COMPREHENSIVE METABOLIC PANEL
ALBUMIN: 3.5 g/dL (ref 3.5–5.0)
ALK PHOS: 90 U/L (ref 38–126)
ALT: 29 U/L (ref 14–54)
ANION GAP: 8 (ref 5–15)
AST: 34 U/L (ref 15–41)
BUN: <5 mg/dL — ABNORMAL LOW (ref 6–20)
CALCIUM: 9.2 mg/dL (ref 8.9–10.3)
CO2: 27 mmol/L (ref 22–32)
Chloride: 103 mmol/L (ref 101–111)
Creatinine, Ser: 0.84 mg/dL (ref 0.44–1.00)
GFR calc Af Amer: >60 mL/min (ref >60)
GFR calc non Af Amer: >60 mL/min (ref >60)
Glucose, Bld: 117 mg/dL — ABNORMAL HIGH (ref 65–99)
Potassium: 4.2 mmol/L (ref 3.5–5.1)
Sodium: 138 mmol/L (ref 135–145)
Total Bilirubin: 0.3 mg/dL (ref 0.3–1.2)
Total Protein: 7.5 g/dL (ref 6.5–8.1)

## 2015-03-26 LAB — CBC
HEMATOCRIT: 38.7 % (ref 36.0–46.0)
Hemoglobin: 12.1 g/dL (ref 12.0–15.0)
MCH: 23.3 pg — AB (ref 26.0–34.0)
MCHC: 31.3 g/dL (ref 30.0–36.0)
MCV: 74.6 fL — ABNORMAL LOW (ref 78.0–100.0)
Platelets: 312 10*3/uL (ref 150–400)
RBC: 5.19 MIL/uL — ABNORMAL HIGH (ref 3.87–5.11)
RDW: 15.9 % — ABNORMAL HIGH (ref 11.5–15.5)
WBC: 4.4 10*3/uL (ref 4.0–10.5)

## 2015-03-26 LAB — LIPASE, BLOOD: Lipase: 26 U/L (ref 11–51)

## 2015-03-26 NOTE — ED Notes (Signed)
Pt reports body aches and chills x2 weeks. Pt also has lower abd pain and diarrhea for 1 week. 3 episodes today.

## 2015-03-27 ENCOUNTER — Emergency Department (HOSPITAL_COMMUNITY)
Admission: EM | Admit: 2015-03-27 | Discharge: 2015-03-27 | Disposition: A | Discharge status: Home / Self Care | Payer: Medicaid - Out of State | Attending: Emergency Medicine | Admitting: Emergency Medicine

## 2015-03-27 DIAGNOSIS — R197 Diarrhea, unspecified: Secondary | ICD-10-CM

## 2015-03-27 DIAGNOSIS — R519 Headache, unspecified: Secondary | ICD-10-CM

## 2015-03-27 DIAGNOSIS — R110 Nausea: Secondary | ICD-10-CM

## 2015-03-27 DIAGNOSIS — B349 Viral infection, unspecified: Secondary | ICD-10-CM

## 2015-03-27 DIAGNOSIS — R51 Headache: Secondary | ICD-10-CM

## 2015-03-27 LAB — RAPID STREP SCREEN (MED CTR MEBANE ONLY): STREPTOCOCCUS, GROUP A SCREEN (DIRECT): NEGATIVE

## 2015-03-27 MED ORDER — SODIUM CHLORIDE 0.9 % IV SOLN
1000.0000 mL | Freq: Once | INTRAVENOUS | Status: AC
  Administered 2015-03-27: 1000 mL via INTRAVENOUS

## 2015-03-27 MED ORDER — ACETAMINOPHEN 325 MG PO TABS
650.0000 mg | ORAL_TABLET | Freq: Once | ORAL | Status: AC
Start: 2015-03-27 — End: 2015-03-27
  Administered 2015-03-27: 650 mg via ORAL
  Filled 2015-03-27: qty 2

## 2015-03-27 MED ORDER — SODIUM CHLORIDE 0.9 % IV BOLUS (SEPSIS)
1000.0000 mL | Freq: Once | INTRAVENOUS | Status: AC
  Administered 2015-03-27: 1000 mL via INTRAVENOUS

## 2015-03-27 MED ORDER — KETOROLAC TROMETHAMINE 30 MG/ML IJ SOLN
30.0000 mg | Freq: Once | INTRAMUSCULAR | Status: AC
  Administered 2015-03-27: 30 mg via INTRAVENOUS
  Filled 2015-03-27: qty 1

## 2015-03-27 MED ORDER — DEXAMETHASONE SODIUM PHOSPHATE 10 MG/ML IJ SOLN
10.0000 mg | Freq: Once | INTRAMUSCULAR | Status: AC
  Administered 2015-03-27: 10 mg via INTRAVENOUS
  Filled 2015-03-27: qty 1

## 2015-03-27 MED ORDER — ONDANSETRON HCL 4 MG/2ML IJ SOLN
4.0000 mg | Freq: Once | INTRAMUSCULAR | Status: AC
  Administered 2015-03-27: 4 mg via INTRAVENOUS
  Filled 2015-03-27: qty 2

## 2015-03-27 MED ORDER — SODIUM CHLORIDE 0.9 % IV SOLN: 1000.0000 mL | INTRAVENOUS | Status: DC

## 2015-03-27 MED ORDER — LOPERAMIDE HCL 2 MG PO CAPS
4.0000 mg | ORAL_CAPSULE | Freq: Once | ORAL | Status: AC
  Administered 2015-03-27: 4 mg via ORAL
  Filled 2015-03-27: qty 2

## 2015-03-27 MED ORDER — DIPHENHYDRAMINE HCL 50 MG/ML IJ SOLN
25.0000 mg | Freq: Once | INTRAMUSCULAR | Status: AC
  Administered 2015-03-27: 25 mg via INTRAVENOUS
  Filled 2015-03-27: qty 1

## 2015-03-27 MED ORDER — METOCLOPRAMIDE HCL 5 MG/ML IJ SOLN
10.0000 mg | Freq: Once | INTRAMUSCULAR | Status: AC
Start: 2015-03-27 — End: 2015-03-27
  Administered 2015-03-27: 10 mg via INTRAVENOUS
  Filled 2015-03-27: qty 2

## 2015-03-27 MED ORDER — METOCLOPRAMIDE HCL 10 MG PO TABS: 10.0000 mg | ORAL_TABLET | Freq: Four times a day (QID) | ORAL | Status: AC | PRN

## 2015-03-27 NOTE — ED Notes (Signed)
MD at bedside. 

## 2015-03-27 NOTE — ED Provider Notes (Signed)
CSN: [id number]     Arrival date & time 03/26/15  2224 History  By signing my name below, I, Soijett Blue, attest that this documentation has been prepared under the direction and in the presence of Dione Booze, MD. Electronically Signed: Soijett Blue, ED Scribe. 03/27/2015. 2:30 AM.   Chief Complaint  Patient presents with  . Abdominal Pain  . Chills     The history is provided by the patient. No language interpreter was used.     HPI Comments: Kiara Shaw is a 25 y.o. female who presents to the Emergency Department complaining of lower abdominal pain/cramping onset 2 weeks. She reports that she has been seen for her symptoms and she was Rx 5 day course of abx for her symptoms from a hospital in [place], [place]. She is unsure of what she had at the time but she knows that she was treated with abx. She states that she is having associated symptoms of chills, generalized body aches, diarrhea x 3 episodes for 2 weeks, productive cough, sore throat, appetite change, and nausea. She states that she has tried OTC anti-diarrheal and tylenol cold max with mild relief for her symptoms. She denies fever, vomiting, and any other symptoms. She notes that she has sick contacts of her father.    History reviewed. No pertinent past medical history. History reviewed. No pertinent past surgical history. No family history on file. Social History  Substance Use Topics  . Smoking status: Never Smoker   . Smokeless tobacco: None  . Alcohol Use: No   OB History    No data available     Review of Systems  Constitutional: Positive for chills and appetite change. Negative for fever.  HENT: Positive for sore throat.   Respiratory: Positive for cough.   Gastrointestinal: Positive for nausea, abdominal pain and diarrhea. Negative for vomiting.  Musculoskeletal: Positive for myalgias.  All other systems reviewed and are negative.     Allergies  Cefotaxime  Home Medications   Prior to Admission  medications   Medication Sig Start Date End Date Taking? Authorizing Provider  naproxen (NAPROSYN) 500 MG tablet Take 1 tablet (500 mg total) by mouth 2 (two) times daily. 12/30/13   Linwood Dibbles, MD   BP 116/71 mmHg  Pulse 125  Temp(Src) 100.1 F (37.8 C) (Oral)  Resp 16  Ht 5\' 11"  (1.803 m)  Wt 250 lb (113.399 kg)  BMI 34.88 kg/m2  SpO2 98%  LMP 02/23/2015 Physical Exam  Constitutional: She is oriented to person, place, and time. She appears well-developed and well-nourished. No distress.  HENT:  Head: Normocephalic and atraumatic.  Mouth/Throat: Posterior oropharyngeal erythema (mildly) present.  Eyes: EOM are normal. Pupils are equal, round, and reactive to light.  Neck: Normal range of motion. Neck supple.  Cardiovascular: Normal rate, regular rhythm and normal heart sounds.  Exam reveals no gallop and no friction rub.   No murmur heard. Pulmonary/Chest: Effort normal and breath sounds normal. No respiratory distress. She has no wheezes. She has no rales.  Abdominal: Soft. She exhibits no distension and no mass. Bowel sounds are decreased. There is no tenderness.  Bowel sounds slightly decreased  Musculoskeletal: Normal range of motion. She exhibits no edema.  Neurological: She is alert and oriented to person, place, and time. No cranial nerve deficit. She exhibits normal muscle tone. Coordination normal.  Skin: Skin is warm and dry. No rash noted.  Psychiatric: She has a normal mood and affect. Her behavior is normal. Judgment and thought  content normal.  Nursing note and vitals reviewed.   ED Course  Procedures (including critical care time) DIAGNOSTIC STUDIES: Oxygen Saturation is 98% on RA, nl by my interpretation.    COORDINATION OF CARE: 2:28 AM Discussed treatment plan with pt at bedside which includes labs, UA, and pt agreed to plan.    Labs Review Results for orders placed or performed during the hospital encounter of 03/27/15  Rapid strep screen (not at [facility])   Result Value Ref Range   Streptococcus, Group A Screen (Direct) NEGATIVE NEGATIVE  Lipase, blood  Result Value Ref Range   Lipase 26 11 - 51 U/L  Comprehensive metabolic panel  Result Value Ref Range   Sodium 138 135 - 145 mmol/L   Potassium 4.2 3.5 - 5.1 mmol/L   Chloride 103 101 - 111 mmol/L   CO2 27 22 - 32 mmol/L   Glucose, Bld 117 (H) 65 - 99 mg/dL   BUN <5 (L) 6 - 20 mg/dL   Creatinine, Ser 1.610.84 0.44 - 1.00 mg/dL   Calcium 9.2 8.9 - 09.610.3 mg/dL   Total Protein 7.5 6.5 - 8.1 g/dL   Albumin 3.5 3.5 - 5.0 g/dL   AST 34 15 - 41 U/L   ALT 29 14 - 54 U/L   Alkaline Phosphatase 90 38 - 126 U/L   Total Bilirubin 0.3 0.3 - 1.2 mg/dL   GFR calc non Af Amer >60 >60 mL/min   GFR calc Af Amer >60 >60 mL/min   Anion gap 8 5 - 15  CBC  Result Value Ref Range   WBC 4.4 4.0 - 10.5 K/uL   RBC 5.19 (H) 3.87 - 5.11 MIL/uL   Hemoglobin 12.1 12.0 - 15.0 g/dL   HCT 04.538.7 40.936.0 - 81.146.0 %   MCV 74.6 (L) 78.0 - 100.0 fL   MCH 23.3 (L) 26.0 - 34.0 pg   MCHC 31.3 30.0 - 36.0 g/dL   RDW 91.415.9 (H) 78.211.5 - 95.615.5 %   Platelets 312 150 - 400 K/uL   I have personally reviewed and evaluated these lab results as part of my medical decision-making.   MDM   Final diagnoses:  Viral syndrome  Nausea  Diarrhea, unspecified type  Headache, unspecified headache type    Illness comprising sore throat, nausea, diarrhea, headache. This does seem to be a viral illness. Apparently she was treated with a course of azithromycin without benefit. Strep screen was obtained which was negative. She is given IV fluids, ketorolac, dexamethasone, ondansetron with slight relief. She was also given oral loperamide and no longer feels like she has impending diarrhea. At this point, her main complaint was headache. She was given additional fluids as well as metoclopramide and diphenhydramine with significant improvement. Laboratory workup is unremarkable and suggestive of viral illness. This was explained to patient. She is  discharged with structures a drink plenty of fluids. She is given prescription for metoclopramide. She's otherwise use over-the-counter acetaminophen, ibuprofen, and loperamide as needed.  I personally performed the services described in this documentation, which was scribed in my presence. The recorded information has been reviewed and is accurate.      Dione Boozeavid Edker Punt, MD 03/27/15 [phone]

## 2015-03-27 NOTE — ED Notes (Signed)
Pt left with all her belongings and ambulated out of the treatment area.  

## 2015-03-27 NOTE — Discharge Instructions (Signed)
Take acetaminophen (Tylenol) or ibuprofen (Advil, Motrin) as needed for fever or aching. Drink plenty of fluids. Take loperamide (Imodium AD) as needed for diarrhea.  Nausea, Adult Nausea is the feeling that you have an upset stomach or have to vomit. Nausea by itself is not likely a serious concern, but it may be an early sign of more serious medical problems. As nausea gets worse, it can lead to vomiting. If vomiting develops, there is the risk of dehydration.  CAUSES   Viral infections.  Food poisoning.  Medicines.  Pregnancy.  Motion sickness.  Migraine headaches.  Emotional distress.  Severe pain from any source.  Alcohol intoxication. HOME CARE INSTRUCTIONS  Get plenty of rest.  Ask your caregiver about specific rehydration instructions.  Eat small amounts of food and sip liquids more often.  Take all medicines as told by your caregiver. SEEK MEDICAL CARE IF:  You have not improved after 2 days, or you get worse.  You have a headache. SEEK IMMEDIATE MEDICAL CARE IF:   You have a fever.  You faint.  You keep vomiting or have blood in your vomit.  You are extremely weak or dehydrated.  You have dark or bloody stools.  You have severe chest or abdominal pain. MAKE SURE YOU:  Understand these instructions.  Will watch your condition.  Will get help right away if you are not doing well or get worse.   This information is not intended to replace advice given to you by your health care provider. Make sure you discuss any questions you have with your health care provider.   Document Released: 04/09/2004 Document Revised: 03/23/2014 Document Reviewed: 11/12/2010 Elsevier Interactive Patient Education 2016 [place].  Diarrhea Diarrhea is frequent loose and watery bowel movements. It can cause you to feel weak and dehydrated. Dehydration can cause you to become tired and thirsty, have a dry mouth, and have decreased urination that often is dark yellow.  Diarrhea is a sign of another problem, most often an infection that will not last long. In most cases, diarrhea typically lasts 2-3 days. However, it can last longer if it is a sign of something more serious. It is important to treat your diarrhea as directed by your caregiver to lessen or prevent future episodes of diarrhea. CAUSES  Some common causes include:  Gastrointestinal infections caused by viruses, bacteria, or parasites.  Food poisoning or food allergies.  Certain medicines, such as antibiotics, chemotherapy, and laxatives.  Artificial sweeteners and fructose.  Digestive disorders. HOME CARE INSTRUCTIONS  Ensure adequate fluid intake (hydration): Have 1 cup (8 oz) of fluid for each diarrhea episode. Avoid fluids that contain simple sugars or sports drinks, fruit juices, whole milk products, and sodas. Your urine should be clear or pale yellow if you are drinking enough fluids. Hydrate with an oral rehydration solution that you can purchase at pharmacies, retail stores, and online. You can prepare an oral rehydration solution at home by mixing the following ingredients together:   - tsp table salt.   tsp baking soda.   tsp salt substitute containing potassium chloride.  1  tablespoons sugar.  1 L (34 oz) of water.  Certain foods and beverages may increase the speed at which food moves through the gastrointestinal (GI) tract. These foods and beverages should be avoided and include:  Caffeinated and alcoholic beverages.  High-fiber foods, such as raw fruits and vegetables, nuts, seeds, and whole grain breads and cereals.  Foods and beverages sweetened with sugar alcohols, such as  xylitol, sorbitol, and mannitol.  Some foods may be well tolerated and may help thicken stool including:  Starchy foods, such as rice, toast, pasta, low-sugar cereal, oatmeal, grits, baked potatoes, crackers, and bagels.  Bananas.  Applesauce.  Add probiotic-rich foods to help increase  healthy bacteria in the GI tract, such as yogurt and fermented milk products.  Wash your hands well after each diarrhea episode.  Only take over-the-counter or prescription medicines as directed by your caregiver.  Take a warm bath to relieve any burning or pain from frequent diarrhea episodes. SEEK IMMEDIATE MEDICAL CARE IF:   You are unable to keep fluids down.  You have persistent vomiting.  You have blood in your stool, or your stools are black and tarry.  You do not urinate in 6-8 hours, or there is only a small amount of very dark urine.  You have abdominal pain that increases or localizes.  You have weakness, dizziness, confusion, or light-headedness.  You have a severe headache.  Your diarrhea gets worse or does not get better.  You have a fever or persistent symptoms for more than 2-3 days.  You have a fever and your symptoms suddenly get worse. MAKE SURE YOU:   Understand these instructions.  Will watch your condition.  Will get help right away if you are not doing well or get worse.   This information is not intended to replace advice given to you by your health care provider. Make sure you discuss any questions you have with your health care provider.   Document Released: 02/20/2002 Document Revised: 03/23/2014 Document Reviewed: 11/08/2011 Elsevier Interactive Patient Education 2016 [place] Headache Without Cause A headache is pain or discomfort felt around the head or neck area. The specific cause of a headache may not be found. There are many causes and types of headaches. A few common ones are:  Tension headaches.  Migraine headaches.  Cluster headaches.  Chronic daily headaches. HOME CARE INSTRUCTIONS  Watch your condition for any changes. Take these steps to help with your condition: Managing Pain  Take over-the-counter and prescription medicines only as told by your health care provider.  Lie down in a dark, quiet room when  you have a headache.  If directed, apply ice to the head and neck area:  Put ice in a plastic bag.  Place a towel between your skin and the bag.  Leave the ice on for 20 minutes, 2-3 times per day.  Use a heating pad or hot shower to apply heat to the head and neck area as told by your health care provider.  Keep lights dim if bright lights bother you or make your headaches worse. Eating and Drinking  Eat meals on a regular schedule.  Limit alcohol use.  Decrease the amount of caffeine you drink, or stop drinking caffeine. General Instructions  Keep all follow-up visits as told by your health care provider. This is important.  Keep a headache journal to help find out what may trigger your headaches. For example, write down:  What you eat and drink.  How much sleep you get.  Any change to your diet or medicines.  Try massage or other relaxation techniques.  Limit stress.  Sit up straight, and do not tense your muscles.  Do not use tobacco products, including cigarettes, chewing tobacco, or e-cigarettes. If you need help quitting, ask your health care provider.  Exercise regularly as told by your health care provider.  Sleep on a regular schedule.  Get 7-9 hours of sleep, or the amount recommended by your health care provider. SEEK MEDICAL CARE IF:   Your symptoms are not helped by medicine.  You have a headache that is different from the usual headache.  You have nausea or you vomit.  You have a fever. SEEK IMMEDIATE MEDICAL CARE IF:   Your headache becomes severe.  You have repeated vomiting.  You have a stiff neck.  You have a loss of vision.  You have problems with speech.  You have pain in the eye or ear.  You have muscular weakness or loss of muscle control.  You lose your balance or have trouble walking.  You feel faint or pass out.  You have confusion.   This information is not intended to replace advice given to you by your health care  provider. Make sure you discuss any questions you have with your health care provider.   Document Released: 03/02/2005 Document Revised: 11/21/2014 Document Reviewed: 06/25/2014 Elsevier Interactive Patient Education 2016 [employer] Inc[employer]  Metoclopramide tablets What is this medicine? METOCLOPRAMIDE (met oh kloe PRA mide) is used to treat the symptoms of gastroesophageal reflux disease (GERD) like heartburn. It is also used to treat people with slow emptying of the stomach and intestinal tract. This medicine may be used for other purposes; ask your health care provider or pharmacist if you have questions. What should I tell my health care provider before I take this medicine? They need to know if you have any of these conditions: -breast cancer -depression -diabetes -heart failure -high blood pressure -kidney disease -liver disease -Parkinson's disease or a movement disorder -pheochromocytoma -seizures -stomach obstruction, bleeding, or perforation -an unusual or allergic reaction to metoclopramide, procainamide, sulfites, other medicines, foods, dyes, or preservatives -pregnant or trying to get pregnant -breast-feeding How should I use this medicine? Take this medicine by mouth with a glass of water. Follow the directions on the prescription label. Take this medicine on an empty stomach, about 30 minutes before eating. Take your doses at regular intervals. Do not take your medicine more often than directed. Do not stop taking except on the advice of your doctor or health care professional. A special MedGuide will be given to you by the pharmacist with each prescription and refill. Be sure to read this information carefully each time. Talk to your pediatrician regarding the use of this medicine in children. Special care may be needed. Overdosage: If you think you have taken too much of this medicine contact a poison control center or emergency room at once. NOTE: This medicine is only for  you. Do not share this medicine with others. What if I miss a dose? If you miss a dose, take it as soon as you can. If it is almost time for your next dose, take only that dose. Do not take double or extra doses. What may interact with this medicine? -acetaminophen -cyclosporine -digoxin -medicines for blood pressure -medicines for diabetes, including insulin -medicines for hay fever and other allergies -medicines for depression, especially an Monoamine Oxidase Inhibitor (MAOI) -medicines for Parkinson's disease, like levodopa -medicines for sleep or for pain -tetracycline This list may not describe all possible interactions. Give your health care provider a list of all the medicines, herbs, non-prescription drugs, or dietary supplements you use. Also tell them if you smoke, drink alcohol, or use illegal drugs. Some items may interact with your medicine. What should I watch for while using this medicine? It may take a few weeks for your  stomach condition to start to get better. However, do not take this medicine for longer than 12 weeks. The longer you take this medicine, and the more you take it, the greater your chances are of developing serious side effects. If you are an elderly patient, a female patient, or you have diabetes, you may be at an increased risk for side effects from this medicine. Contact your doctor immediately if you start having movements you cannot control such as lip smacking, rapid movements of the tongue, involuntary or uncontrollable movements of the eyes, head, arms and legs, or muscle twitches and spasms. Patients and their families should watch out for worsening depression or thoughts of suicide. Also watch out for any sudden or severe changes in feelings such as feeling anxious, agitated, panicky, irritable, hostile, aggressive, impulsive, severely restless, overly excited and hyperactive, or not being able to sleep. If this happens, especially at the beginning of  treatment or after a change in dose, call your doctor. Do not treat yourself for high fever. Ask your doctor or health care professional for advice. You may get drowsy or dizzy. Do not drive, use machinery, or do anything that needs mental alertness until you know how this drug affects you. Do not stand or sit up quickly, especially if you are an older patient. This reduces the risk of dizzy or fainting spells. Alcohol can make you more drowsy and dizzy. Avoid alcoholic drinks. What side effects may I notice from receiving this medicine? Side effects that you should report to your doctor or health care professional as soon as possible: -allergic reactions like skin rash, itching or hives, swelling of the face, lips, or tongue -abnormal production of milk in females -breast enlargement in both males and females -change in the way you walk -difficulty moving, speaking or swallowing -drooling, lip smacking, or rapid movements of the tongue -excessive sweating -fever -involuntary or uncontrollable movements of the eyes, head, arms and legs -irregular heartbeat or palpitations -muscle twitches and spasms -unusually weak or tired Side effects that usually do not require medical attention (report to your doctor or health care professional if they continue or are bothersome): -change in sex drive or performance -depressed mood -diarrhea -difficulty sleeping -headache -menstrual changes -restless or nervous This list may not describe all possible side effects. Call your doctor for medical advice about side effects. You may report side effects to FDA at 1-800-FDA-1088. Where should I keep my medicine? Keep out of the reach of children. Store at room temperature between 20 and 25 degrees C (68 and 77 degrees F). Protect from light. Keep container tightly closed. Throw away any unused medicine after the expiration date. NOTE: This sheet is a summary. It may not cover all possible information. If you  have questions about this medicine, talk to your doctor, pharmacist, or health care provider.    2016, Elsevier/[employer]. (2011-06-30 13:04:38)

## 2015-03-29 LAB — CULTURE, GROUP A STREP (THRC)

## 2015-07-13 ENCOUNTER — Emergency Department (HOSPITAL_COMMUNITY)
Admission: EM | Admit: 2015-07-13 | Discharge: 2015-07-13 | Disposition: A | Discharge status: Home / Self Care | Payer: No Typology Code available for payment source | Attending: Emergency Medicine | Admitting: Emergency Medicine

## 2015-07-13 ENCOUNTER — Encounter (HOSPITAL_COMMUNITY): Payer: Self-pay | Admitting: Emergency Medicine

## 2015-07-13 ENCOUNTER — Emergency Department (HOSPITAL_COMMUNITY): Payer: No Typology Code available for payment source

## 2015-07-13 DIAGNOSIS — S301XXA Contusion of abdominal wall, initial encounter: Secondary | ICD-10-CM | POA: Diagnosis not present

## 2015-07-13 DIAGNOSIS — Y92410 Unspecified street and highway as the place of occurrence of the external cause: Secondary | ICD-10-CM | POA: Diagnosis not present

## 2015-07-13 DIAGNOSIS — S92155A Nondisplaced avulsion fracture (chip fracture) of left talus, initial encounter for closed fracture: Secondary | ICD-10-CM | POA: Insufficient documentation

## 2015-07-13 DIAGNOSIS — S92102A Unspecified fracture of left talus, initial encounter for closed fracture: Secondary | ICD-10-CM

## 2015-07-13 DIAGNOSIS — Y9389 Activity, other specified: Secondary | ICD-10-CM | POA: Insufficient documentation

## 2015-07-13 DIAGNOSIS — S99912A Unspecified injury of left ankle, initial encounter: Secondary | ICD-10-CM | POA: Diagnosis present

## 2015-07-13 DIAGNOSIS — Y998 Other external cause status: Secondary | ICD-10-CM | POA: Diagnosis not present

## 2015-07-13 LAB — CBC WITH DIFFERENTIAL/PLATELET
BASOS ABS: 0 10*3/uL (ref 0.0–0.1)
BASOS PCT: 0 %
EOS PCT: 1 %
Eosinophils Absolute: 0.1 10*3/uL (ref 0.0–0.7)
HCT: 34.6 % — ABNORMAL LOW (ref 36.0–46.0)
Hemoglobin: 10.8 g/dL — ABNORMAL LOW (ref 12.0–15.0)
Lymphocytes Relative: 26 %
Lymphs Abs: 1.7 10*3/uL (ref 0.7–4.0)
MCH: 23.9 pg — ABNORMAL LOW (ref 26.0–34.0)
MCHC: 31.2 g/dL (ref 30.0–36.0)
MCV: 76.7 fL — ABNORMAL LOW (ref 78.0–100.0)
MONO ABS: 0.4 10*3/uL (ref 0.1–1.0)
Monocytes Relative: 5 %
Neutro Abs: 4.6 10*3/uL (ref 1.7–7.7)
Neutrophils Relative %: 68 %
PLATELETS: 292 10*3/uL (ref 150–400)
RBC: 4.51 MIL/uL (ref 3.87–5.11)
RDW: 14.8 % (ref 11.5–15.5)
WBC: 6.8 10*3/uL (ref 4.0–10.5)

## 2015-07-13 LAB — COMPREHENSIVE METABOLIC PANEL
ALBUMIN: 3.7 g/dL (ref 3.5–5.0)
ALT: 20 U/L (ref 14–54)
ANION GAP: 8 (ref 5–15)
AST: 25 U/L (ref 15–41)
Alkaline Phosphatase: 85 U/L (ref 38–126)
BUN: 6 mg/dL (ref 6–20)
CHLORIDE: 104 mmol/L (ref 101–111)
CO2: 24 mmol/L (ref 22–32)
Calcium: 9.5 mg/dL (ref 8.9–10.3)
Creatinine, Ser: 0.73 mg/dL (ref 0.44–1.00)
GFR calc Af Amer: >60 mL/min (ref >60)
GLUCOSE: 90 mg/dL (ref 65–99)
Potassium: 3.7 mmol/L (ref 3.5–5.1)
Sodium: 136 mmol/L (ref 135–145)
Total Bilirubin: 0.3 mg/dL (ref 0.3–1.2)
Total Protein: 8.2 g/dL — ABNORMAL HIGH (ref 6.5–8.1)

## 2015-07-13 LAB — I-STAT CG4 LACTIC ACID, ED: LACTIC ACID, VENOUS: 0.79 mmol/L (ref 0.5–2.0)

## 2015-07-13 LAB — I-STAT BETA HCG BLOOD, ED (MC, WL, AP ONLY)

## 2015-07-13 MED ORDER — IBUPROFEN 600 MG PO TABS: 600.0000 mg | ORAL_TABLET | Freq: Four times a day (QID) | ORAL | Status: DC | PRN

## 2015-07-13 MED ORDER — MORPHINE SULFATE 15 MG PO TABS: 15.0000 mg | ORAL_TABLET | Freq: Four times a day (QID) | ORAL | Status: AC | PRN

## 2015-07-13 MED ORDER — IBUPROFEN 600 MG PO TABS: 600.0000 mg | ORAL_TABLET | Freq: Four times a day (QID) | ORAL | Status: AC | PRN

## 2015-07-13 MED ORDER — IOPAMIDOL (ISOVUE-300) INJECTION 61%
INTRAVENOUS | Status: AC
  Administered 2015-07-13: 100 mL
  Filled 2015-07-13: qty 100

## 2015-07-13 MED ORDER — OXYCODONE HCL 5 MG PO TABS: 5.0000 mg | ORAL_TABLET | Freq: Four times a day (QID) | ORAL | Status: DC | PRN

## 2015-07-13 MED ORDER — IBUPROFEN 800 MG PO TABS
800.0000 mg | ORAL_TABLET | Freq: Once | ORAL | Status: AC
  Administered 2015-07-13: 800 mg via ORAL
  Filled 2015-07-13: qty 1
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
## 2015-07-13 NOTE — Discharge Instructions (Signed)
Cast or Splint Care Casts and splints support injured limbs and keep bones from moving while they heal.  HOME CARE  Keep the cast or splint uncovered during the drying period.  A plaster cast can take 24 to 48 hours to dry.  A fiberglass cast will dry in less than 1 hour.  Do not rest the cast on anything harder than a pillow for 24 hours.  Do not put weight on your injured limb. Do not put pressure on the cast. Wait for your doctor's approval.  Keep the cast or splint dry.  Cover the cast or splint with a plastic bag during baths or wet weather.  If you have a cast over your chest and belly (trunk), take sponge baths until the cast is taken off.  If your cast gets wet, dry it with a towel or blow dryer. Use the cool setting on the blow dryer.  Keep your cast or splint clean. Wash a dirty cast with a damp cloth.  Do not put any objects under your cast or splint.  Do not scratch the skin under the cast with an object. If itching is a problem, use a blow dryer on a cool setting over the itchy area.  Do not trim or cut your cast.  Do not take out the padding from inside your cast.  Exercise your joints near the cast as told by your doctor.  Raise (elevate) your injured limb on 1 or 2 pillows for the first 1 to 3 days. GET HELP IF:  Your cast or splint cracks.  Your cast or splint is too tight or too loose.  You itch badly under the cast.  Your cast gets wet or has a soft spot.  You have a bad smell coming from the cast.  You get an object stuck under the cast.  Your skin around the cast becomes red or sore.  You have new or more pain after the cast is put on. GET HELP RIGHT AWAY IF:  You have fluid leaking through the cast.  You cannot move your fingers or toes.  Your fingers or toes turn blue or white or are cool, painful, or puffy (swollen).  You have tingling or lose feeling (numbness) around the injured area.  You have bad pain or pressure under the  cast.  You have trouble breathing or have shortness of breath.  You have chest pain.   This information is not intended to replace advice given to you by your health care provider. Make sure you discuss any questions you have with your health care provider.   Document Released: 07/02/2010 Document Revised: 11/02/2012 Document Reviewed: 09/08/2012 Elsevier Interactive Patient Education 2016 [employer]  Contusion A contusion is a deep bruise. Contusions are the result of a blunt injury to tissues and muscle fibers under the skin. The injury causes bleeding under the skin. The skin overlying the contusion may turn blue, purple, or yellow. Minor injuries will give you a painless contusion, but more severe contusions may stay painful and swollen for a few weeks.  CAUSES  This condition is usually caused by a blow, trauma, or direct force to an area of the body. SYMPTOMS  Symptoms of this condition include:  Swelling of the injured area.  Pain and tenderness in the injured area.  Discoloration. The area may have redness and then turn blue, purple, or yellow. DIAGNOSIS  This condition is diagnosed based on a physical exam and medical history. An X-ray, CT scan,  or MRI may be needed to determine if there are any associated injuries, such as broken bones (fractures). TREATMENT  Specific treatment for this condition depends on what area of the body was injured. In general, the best treatment for a contusion is resting, icing, applying pressure to (compression), and elevating the injured area. This is often called the RICE strategy. Over-the-counter anti-inflammatory medicines may also be recommended for pain control.  HOME CARE INSTRUCTIONS   Rest the injured area.  If directed, apply ice to the injured area:  Put ice in a plastic bag.  Place a towel between your skin and the bag.  Leave the ice on for 20 minutes, 2-3 times per day.  If directed, apply light compression to the  injured area using an elastic bandage. Make sure the bandage is not wrapped too tightly. Remove and reapply the bandage as directed by your health care provider.  If possible, raise (elevate) the injured area above the level of your heart while you are sitting or lying down.  Take over-the-counter and prescription medicines only as told by your health care provider. SEEK MEDICAL CARE IF:  Your symptoms do not improve after several days of treatment.  Your symptoms get worse.  You have difficulty moving the injured area. SEEK IMMEDIATE MEDICAL CARE IF:   You have severe pain.  You have numbness in a hand or foot.  Your hand or foot turns pale or cold.   This information is not intended to replace advice given to you by your health care provider. Make sure you discuss any questions you have with your health care provider.   Document Released: 12/10/2004 Document Revised: 11/21/2014 Document Reviewed: 07/18/2014 Elsevier Interactive Patient Education 2016 [employer] Inc.  Blunt Abdominal Trauma Blunt abdominal trauma is a type of injury that involves damage to the abdominal wall or to abdominal organs, such as the liver or spleen. The damage can involve bruising, tearing, or a rupture. This type of injury does not involve a puncture of the skin. Blunt abdominal trauma can range from mild to severe. In some cases it can lead to a severe abdominal inflammation (peritonitis), severe bleeding, and a dangerous drop in blood pressure. CAUSES This injury is caused by a hard, direct hit to the abdomen. It can happen after:  A motor vehicle accident.  Being kicked or punched in the abdomen.  Falling from a significant height. RISK FACTORS This injury is more likely to happen in people who:  Play contact sports.  Work in a job in which falls or injuries are more likely, such as in Holiday representativeconstruction. SYMPTOMS The main symptom of this condition is pain in the abdomen. Other symptoms depend on  the type and location of the injury. They can include:  Abdominal pain that spreads to the the back or shoulder.  Bruising.  Swelling.  Pain when pressing on the abdomen.  Blood in the urine.  Weakness.  Confusion.  Loss of consciousness.  Pale, dusky, cool, or sweaty skin.  Vomiting blood.  Bloody stool or bleeding from the rectum.  Trouble breathing. Symptoms of this injury can develop suddenly or slowly.  DIAGNOSIS This injury is diagnosed based on your symptoms and a physical exam. You may also have tests, including:  Blood tests.  Urine tests.  Imaging tests, such as:  A CT scan and ultrasound of your abdomen.  X-rays of your chest and abdomen.  A test in which a tube is used to flush your abdomen with fluid and  check for blood (diagnostic peritoneal lavage). TREATMENT Treatment for this injury depends on its type and severity. Treatment options include:  Observation. If the injury is mild, this may be the only treatment needed.  Support of your blood pressure and breathing.  Getting blood, fluids, or medicine through an IV tube.  Antibiotic medicine.  Insertion of tubes into the stomach or bladder.  A blood transfusion.  A procedure to stop bleeding. This involves putting a long, thin tube (catheter) into one of your blood vessels (angiographic embolization).  Surgery to open up your abdomen and control bleeding or repair damage (laparotomy). This may be done if tests suggest that you have peritonitis or bleeding that cannot be controlled with angiographic embolization. HOME CARE INSTRUCTIONS  Take medicines only as directed by your health care provider.  If you were prescribed an antibiotic medicine, finish all of it even if you start to feel better.  Follow your health care provider's instructions about diet and activity restrictions.  Keep all follow-up visits as directed by your health care provider. This is important. SEEK MEDICAL CARE  IF:  You continue to have abdominal pain.  Your symptoms return.  You develop new symptoms.  You have blood in your urine or your bowel movements. SEEK IMMEDIATE MEDICAL CARE IF:  You vomit blood.  You have heavy bleeding from your rectum.  You have very bad abdominal pain.  You have trouble breathing.  You have chest pain.  You have a fever.  You have dizziness.  You pass out.   This information is not intended to replace advice given to you by your health care provider. Make sure you discuss any questions you have with your health care provider.   Document Released: 04/09/2004 Document Revised: 07/17/2014 Document Reviewed: 02/21/2014 Elsevier Interactive Patient Education [employer]

## 2015-07-13 NOTE — Progress Notes (Signed)
Orthopedic Tech Progress Note Patient Details:  Kiara Shaw 28-Feb-1991 [id number]  Ortho Devices Type of Ortho Device: Ace wrap, Post (short leg) splint, Crutches Ortho Device/Splint Location: LLE Ortho Device/Splint Interventions: Ordered, Application   Kiara Shaw, Kiara Shaw 07/13/2015, 8:44 PM

## 2015-07-13 NOTE — ED Notes (Signed)
Ortho at bedside.
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
## 2015-07-13 NOTE — ED Notes (Signed)
Patient verbalized understanding of discharge instructions and denies any further needs or questions at this time. VS stable. Patient ambulatory with steady gait, assisted to ED entrance in wheelchair.  
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
## 2021-06-11 ENCOUNTER — Emergency Department: Payer: Medicaid (Managed Care)

## 2021-06-11 ENCOUNTER — Emergency Department
Admission: EM | Admit: 2021-06-11 | Discharge: 2021-06-12 | Disposition: A | Discharge status: Home / Self Care | Payer: Medicaid (Managed Care) | Attending: Emergency Medicine | Admitting: Emergency Medicine

## 2021-06-11 DIAGNOSIS — R739 Hyperglycemia, unspecified: Secondary | ICD-10-CM

## 2021-06-11 DIAGNOSIS — R109 Unspecified abdominal pain: Secondary | ICD-10-CM

## 2021-06-11 DIAGNOSIS — E1165 Type 2 diabetes mellitus with hyperglycemia: Secondary | ICD-10-CM | POA: Insufficient documentation

## 2021-06-11 DIAGNOSIS — R1030 Lower abdominal pain, unspecified: Secondary | ICD-10-CM | POA: Insufficient documentation

## 2021-06-11 DIAGNOSIS — M25532 Pain in left wrist: Secondary | ICD-10-CM | POA: Insufficient documentation

## 2021-06-11 DIAGNOSIS — F17210 Nicotine dependence, cigarettes, uncomplicated: Secondary | ICD-10-CM | POA: Insufficient documentation

## 2021-06-11 LAB — LIPASE: Lipase: 17 U/L (ref 8–78)

## 2021-06-11 LAB — URINALYSIS REFLEX TO MICROSCOPIC EXAM - REFLEX TO CULTURE
Bilirubin, UA: NEGATIVE
Blood, UA: NEGATIVE
Glucose, UA: NEGATIVE
Ketones UA: NEGATIVE
Leukocyte Esterase, UA: NEGATIVE
Nitrite, UA: NEGATIVE
Protein, UR: 30 — AB
RBC, UA: 0 /hpf (ref 0–5)
Specific Gravity UA: 1.025 (ref 1.001–1.035)
Urine pH: 7 (ref 5.0–8.0)
Urobilinogen, UA: 1 mg/dL — AB

## 2021-06-11 LAB — HEPATIC FUNCTION PANEL
ALT: 12 U/L (ref 0–55)
AST (SGOT): 11 U/L (ref 5–41)
Albumin/Globulin Ratio: 0.9 (ref 0.9–2.2)
Albumin: 3.4 g/dL — ABNORMAL LOW (ref 3.5–5.0)
Alkaline Phosphatase: 97 U/L (ref 37–117)
Bilirubin Direct: 0.1 mg/dL (ref 0.0–0.5)
Bilirubin Indirect: 0 mg/dL — ABNORMAL LOW (ref 0.2–1.0)
Bilirubin, Total: 0.1 mg/dL — ABNORMAL LOW (ref 0.2–1.2)
Globulin: 4 g/dL — ABNORMAL HIGH (ref 2.0–3.6)
Protein, Total: 7.4 g/dL (ref 6.0–8.3)

## 2021-06-11 LAB — CBC AND DIFFERENTIAL
Absolute NRBC: 0 10*3/uL (ref 0.00–0.00)
Basophils Absolute Automated: 0.07 10*3/uL (ref 0.00–0.08)
Basophils Automated: 0.6 %
Eosinophils Absolute Automated: 0.78 10*3/uL — ABNORMAL HIGH (ref 0.00–0.44)
Eosinophils Automated: 7.1 %
Hematocrit: 35.9 % (ref 34.7–43.7)
Hgb: 11.7 g/dL (ref 11.4–14.8)
Immature Granulocytes Absolute: 0.02 10*3/uL (ref 0.00–0.07)
Immature Granulocytes: 0.2 %
Instrument Absolute Neutrophil Count: 6.23 10*3/uL (ref 1.10–6.33)
Lymphocytes Absolute Automated: 3.37 10*3/uL — ABNORMAL HIGH (ref 0.42–3.22)
Lymphocytes Automated: 30.7 %
MCH: 27.4 pg (ref 25.1–33.5)
MCHC: 32.6 g/dL (ref 31.5–35.8)
MCV: 84.1 fL (ref 78.0–96.0)
MPV: 10.4 fL (ref 8.9–12.5)
Monocytes Absolute Automated: 0.49 10*3/uL (ref 0.21–0.85)
Monocytes: 4.5 %
Neutrophils Absolute: 6.23 10*3/uL (ref 1.10–6.33)
Neutrophils: 56.9 %
Nucleated RBC: 0 /100 WBC (ref 0.0–0.0)
Platelets: 417 10*3/uL — ABNORMAL HIGH (ref 142–346)
RBC: 4.27 10*6/uL (ref 3.90–5.10)
RDW: 15 % (ref 11–15)
WBC: 10.96 10*3/uL — ABNORMAL HIGH (ref 3.10–9.50)

## 2021-06-11 LAB — BASIC METABOLIC PANEL
Anion Gap: 9 (ref 5.0–15.0)
BUN: 9 mg/dL (ref 7.0–21.0)
CO2: 25 mEq/L (ref 17–29)
Calcium: 9.2 mg/dL (ref 8.5–10.5)
Chloride: 107 mEq/L (ref 99–111)
Creatinine: 0.7 mg/dL (ref 0.4–1.0)
Glucose: 217 mg/dL — ABNORMAL HIGH (ref 70–100)
Potassium: 4.3 mEq/L (ref 3.5–5.3)
Sodium: 141 mEq/L (ref 135–145)

## 2021-06-11 LAB — GFR: EGFR: >60

## 2021-06-11 LAB — URINE HCG QUALITATIVE: Urine HCG Qualitative: NEGATIVE

## 2021-06-11 MED ORDER — SODIUM CHLORIDE 0.9 % IV BOLUS
1000.0000 mL | Freq: Once | INTRAVENOUS | Status: AC
Start: 2021-06-11 — End: 2021-06-12
  Administered 2021-06-11: 23:00:00 1000 mL via INTRAVENOUS

## 2021-06-11 MED ORDER — DICYCLOMINE HCL 10 MG PO CAPS
10.0000 mg | ORAL_CAPSULE | Freq: Four times a day (QID) | ORAL | 0 refills | Status: AC | PRN
Start: 2021-06-11 — End: ?

## 2021-06-11 MED ORDER — FAMOTIDINE 20 MG PO TABS
20.0000 mg | ORAL_TABLET | Freq: Two times a day (BID) | ORAL | 0 refills | Status: AC
Start: 2021-06-11 — End: 2021-06-21

## 2021-06-11 MED ORDER — ACETAMINOPHEN 500 MG PO TABS
1000.0000 mg | ORAL_TABLET | Freq: Once | ORAL | Status: AC
Start: 2021-06-11 — End: 2021-06-11
  Administered 2021-06-11: 1000 mg via ORAL
  Filled 2021-06-11: qty 2

## 2021-06-11 MED ORDER — FAMOTIDINE 10 MG/ML IV SOLN (WRAP)
20.0000 mg | Freq: Once | INTRAVENOUS | Status: AC
Start: 2021-06-11 — End: 2021-06-11
  Administered 2021-06-11: 23:00:00 20 mg via INTRAVENOUS
  Filled 2021-06-11: qty 2

## 2021-06-11 MED ORDER — ONDANSETRON 4 MG PO TBDP
4.0000 mg | ORAL_TABLET | Freq: Four times a day (QID) | ORAL | 0 refills | Status: AC | PRN
Start: 2021-06-11 — End: ?

## 2021-06-11 NOTE — Discharge Instructions (Signed)
Keep well hydrated and drink plenty of fluids    2. Take Pepcid and Bentyl as instructed    3. Follow-up with primary care and a hand surgeon as recommended    4. Apply ice three times a day to your  left wrist and Tylenol as needed      Hyperglycemia     During the visit today, your blood sugar was found to be high.     The medical term for high blood sugar is hyperglycemia. This may be a one-time event, but it could mean you have diabetes. Untreated diabetes can lead to heart problems and kidney problems (including kidney failure). It can also lead to stroke and blindness. It is very important to follow up with your regular doctor to have your blood sugar re-checked.     Tell your regular doctor that your blood sugar was high today. Your doctor will want to recheck your blood. He or she may also want to order other lab tests. If the doctor finds that you have diabetes, you will need medicine and a special diet to control your blood sugar.     YOU SHOULD SEEK MEDICAL ATTENTION IMMEDIATELY, EITHER HERE OR AT THE NEAREST EMERGENCY DEPARTMENT, IF ANY OF THE FOLLOWING OCCURS:  Confusion or lethargy (very sleepy and hard to wake up).  Signs of dehydration. Signs include less urination (peeing), dry mouth, extreme fatigue (tiredness), lightheadedness or fainting.  Constant vomiting (throwing up).  Fever (temperature higher than 100.37F / 38C) or shaking chills.  Abdominal (belly) pain or vomiting (throwing up).             Abdominal Pain     You have been diagnosed with abdominal (belly) pain. The cause of your pain is not yet known.     Many things can cause abdominal pain such as infections and bowel (intestine) spasms. You might need another examination or more tests to find out why you have pain.     At this time, your pain does not seem to be caused by anything dangerous. You do not need surgery. You do not need to stay in the hospital.      Though we don't believe your condition is dangerous right now, it is  important to be careful. Sometimes a problem that seems mild now can become serious later. If you do not get completely better or your symptoms get worse, you should seek more care. This is why it is important that you get additional help unless you are 100% improved  In 2 to 3 days, return here, go to the nearest Emergency Department or follow up with your regular doctor.     For the next 24 hours, Drink only clear liquids such as:  Water.  Clear broth.  Sports drinks.  Clear caffeine-free soft drinks, like 7-Up or Sprite.     Return here or go to the nearest Emergency Department immediately if:  Your pain does not go away or gets worse.  You cannot keep fluids down   Your vomit (throw up) is dark green.   You vomit (throw up) blood or see blood in your stool (poop). Blood might be bright red or dark red. It can also be black and look like tar.  You have a fever (temperature higher than 100.37F / 38C) or shaking chills.  Your skin or eyes look yellow.  Your urine looks brown.  You have severe diarrhea.     If you can't follow up with your doctor,  or if at any time you feel you need to be rechecked or seen again, come back here or go to the nearest emergency department.

## 2021-06-12 NOTE — ED Provider Notes (Signed)
[facility] EMERGENCY DEPARTMENT H&P      Visit date: 06/11/2021      CLINICAL SUMMARY           Diagnosis:    .     Final diagnoses:   Abdominal pain, unspecified abdominal location   Hyperglycemia   Left wrist pain         MDM Notes:      Hyperglycemia vs DKA vs dehydration vs gastritis vs gastroenteritis vs wrist sprain vs fracture vs arthritis vs septic joint       Medical Decision Making  Amount and/or Complexity of Data Reviewed  Labs: ordered.  Radiology: ordered.    Risk  OTC drugs.  Prescription drug management.             Disposition:      Discharge         Discharge Prescriptions       Medication Sig Dispense Auth. Provider    famotidine (PEPCID) 20 MG tablet Take 1 tablet (20 mg) by mouth 2 (two) times daily for 10 days 20 tablet Skye Plamondon T, MD    dicyclomine (BENTYL) 10 MG capsule Take 1 capsule (10 mg) by mouth every 6 (six) hours as needed (pain) 15 capsule Etheleen Valtierra T, MD    ondansetron (ZOFRAN-ODT) 4 MG disintegrating tablet Take 1 tablet (4 mg) by mouth every 6 (six) hours as needed for Nausea 8 tablet Tyreque Finken T, MD                        CLINICAL INFORMATION        HPI:      Chief Complaint: Emesis and Wrist Pain  .    Susan Olsen is a 31 y.o. female with h/o DM2 and HTN presents to the ER reporting, "I've had lower abdominal pain now for about a week, cramping that comes and goes. I've been nauseous and vomiting as well 2-3 times a day. My left wrist is also hurting. I don't recall hurting it". Patient denies diarrhea, fevers, body aches, cough, congestion, dizziness. Denies trauma. No meds taken. Moderate intensity. No alleviating factors. No sick contacts.    History obtained from: Patient          ROS:      Positive and negative ROS elements as per HPI.  All other systems reviewed and negative.      Physical Exam:      Pulse 87  BP 135/72  Resp 18  SpO2 98 %  Temp 97 F (36.1 C)    Physical Exam  Vitals and  nursing note reviewed.   Constitutional:       General: She is not in acute distress.     Appearance: She is well-developed. She is not diaphoretic.   HENT:      Head: Normocephalic and atraumatic.      Nose: Nose normal.   Eyes:      General: No scleral icterus.     Conjunctiva/sclera: Conjunctivae normal.      Pupils: Pupils are equal, round, and reactive to light.   Neck:      Trachea: No tracheal deviation.   Cardiovascular:      Rate and Rhythm: Normal rate and regular rhythm.      Heart sounds: Normal heart sounds. No murmur heard.     No friction rub.   Pulmonary:      Effort: Pulmonary effort is normal. No respiratory distress.  Breath sounds: Normal breath sounds. No stridor. No wheezing.   Abdominal:      General: There is no distension.      Palpations: Abdomen is soft. There is no pulsatile mass.      Tenderness: There is no abdominal tenderness. There is no guarding or rebound. Negative signs include Murphy's sign and McBurney's sign.      Comments: No peritoneal signs   Musculoskeletal:         General: Normal range of motion.      Right wrist: Normal.      Left wrist: Tenderness present. No swelling, deformity, effusion, bony tenderness, snuff box tenderness or crepitus. Normal range of motion. Normal pulse.      Cervical back: Normal range of motion and neck supple.      Comments: No erythema or signs of cellulitis   Skin:     General: Skin is warm and dry.      Findings: No erythema or rash.   Neurological:      Mental Status: She is alert and oriented to person, place, and time.      Motor: No abnormal muscle tone.      Coordination: Coordination normal.   Psychiatric:         Behavior: Behavior normal.         Thought Content: Thought content normal.         Judgment: Judgment normal.                    PAST HISTORY        Primary Care Provider: Patsy Lager, MD        PMH/PSH:    .     Past Medical History:   Diagnosis Date    Diabetes mellitus     Hypertension        She has no past  surgical history on file.      Social/Family History:      She reports that she has been smoking cigarettes. She does not have any smokeless tobacco history on file. She reports that she does not currently use alcohol. She reports that she does not use drugs.    History reviewed. No pertinent family history.      Listed Medications on Arrival:    .     Home Medications       Med List Status: In Progress Set By: Gwynneth Munson, RN at 06/11/2021 10:17 PM              furosemide (LASIX) 20 MG tablet     Take 20 mg by mouth 2 (two) times daily     metFORMIN (GLUCOPHAGE) 1000 MG tablet     Take 1,000 mg by mouth           Allergies: She has No Known Allergies.            VISIT INFORMATION        Clinical Course in the ED:        Re-eval note:  Patient feels improved, denies nausea or abdominal pain currently, abdominal exam benign, soft/nd/nt/no peritoneal signs, most likely gastritis a/w n/v. Unlikely gastroenteritis or diverticulitis/colitis. Glucose elevated but no signs of DKA, gap wnl. L wrist pain secondary to sprain vs arthritis, no erythema of joint, unlikely septic joint. X-ray neg for fx. Reassurance, velcro splint, f/u with hand. Strict return precautions given.           Medications Given in the ED:    .  ED Medication Orders (From admission, onward)      Start Ordered     Status Ordering Provider    06/11/21 2322 06/11/21 2321  acetaminophen (TYLENOL) tablet 1,000 mg  Once        Route: Oral  Ordered Dose: 1,000 mg     Last MAR action: Given Alcide Evener    06/11/21 2224 06/11/21 2223  sodium chloride 0.9 % bolus 1,000 mL  Once        Route: Intravenous  Ordered Dose: 1,000 mL     Last MAR action: Stopped Beverley Fiedler T    06/11/21 2224 06/11/21 2223  famotidine (PEPCID) injection 20 mg  Once        Route: Intravenous  Ordered Dose: 20 mg     Last MAR action: Given Raegan Sipp T              Procedures:      Procedures      Interpretations:      O2 Sat:  The patient's oxygen  saturation was 98 % on room air. This was independently interpreted by me as Normal.               RESULTS        Lab Results:      Results       Procedure Component Value Units Date/Time    Urinalysis Reflex to Microscopic Exam- Reflex to Culture [[id number]]  (Abnormal) Collected: 06/11/21 2245    Specimen: Urine Updated: 06/11/21 2324     Urine Type Urine, Clean Ca     Color, UA Yellow     Clarity, UA Clear     Specific Gravity UA 1.025     Urine pH 7.0     Leukocyte Esterase, UA Negative     Nitrite, UA Negative     Protein, UR 30     Glucose, UA Negative     Ketones UA Negative     Urobilinogen, UA 1.0 mg/dL      Bilirubin, UA Negative     Blood, UA Negative     RBC, UA 0 -2 /hpf      WBC, UA 0 - 5 /hpf      Squamous Epithelial Cells, Urine 0 - 5 /hpf      Urine Amorphous Many /hpf     Urine HCG Qualitative [[id number]] Collected: 06/11/21 2245    Specimen: Urine Updated: 06/11/21 2324     Urine HCG Qualitative Negative    Hepatic function panel (LFT) [[id number]]  (Abnormal) Collected: 06/11/21 2245    Specimen: Blood Updated: 06/11/21 2321     Bilirubin, Total 0.1 mg/dL      Bilirubin Direct 0.1 mg/dL      Bilirubin Indirect 0.0 mg/dL      AST (SGOT) 11 U/L      ALT 12 U/L      Alkaline Phosphatase 97 U/L      Protein, Total 7.4 g/dL      Albumin 3.4 g/dL      Globulin 4.0 g/dL      Albumin/Globulin Ratio 0.9    Lipase [[id number]] Collected: 06/11/21 2245    Specimen: Blood Updated: 06/11/21 2321     Lipase 17 U/L     GFR [[id number]] Collected: 06/11/21 2245     Updated: 06/11/21 2321     EGFR >60.0    Basic Metabolic Panel [[id number]]  (Abnormal) Collected: 06/11/21 2245    Specimen: Blood Updated: 06/11/21 2321  Glucose 217 mg/dL      BUN 9.0 mg/dL      Creatinine 0.7 mg/dL      Calcium 9.2 mg/dL      Sodium 161 mEq/L      Potassium 4.3 mEq/L      Chloride 107 mEq/L      CO2 25 mEq/L      Anion Gap 9.0    CBC and differential [[id number]]  (Abnormal) Collected: 06/11/21 2245    Specimen: Blood Updated:  06/11/21 2307     WBC 10.96 x10 3/uL      Hgb 11.7 g/dL      Hematocrit 81.1 %      Platelets 417 x10 3/uL      RBC 4.27 x10 6/uL      MCV 84.1 fL      MCH 27.4 pg      MCHC 32.6 g/dL      RDW 15 %      MPV 10.4 fL      Instrument Absolute Neutrophil Count 6.23 x10 3/uL      Neutrophils 56.9 %      Lymphocytes Automated 30.7 %      Monocytes 4.5 %      Eosinophils Automated 7.1 %      Basophils Automated 0.6 %      Immature Granulocytes 0.2 %      Nucleated RBC 0.0 /100 WBC      Neutrophils Absolute 6.23 x10 3/uL      Lymphocytes Absolute Automated 3.37 x10 3/uL      Monocytes Absolute Automated 0.49 x10 3/uL      Eosinophils Absolute Automated 0.78 x10 3/uL      Basophils Absolute Automated 0.07 x10 3/uL      Immature Granulocytes Absolute 0.02 x10 3/uL      Absolute NRBC 0.00 x10 3/uL                 Radiology Results:      Wrist Left PA Lateral and Oblique   Final Result       No acute radiographically detectable abnormality.      Correlate with point tenderness to exclude scaphoid fracture which is   often radiographically occult. Follow-up radiograph may be obtained   within 1 to 2 weeks if clinically indicated.      Debera Lat Merchant   06/11/2021 11:29 PM                  Scribe Attestation:      No scribe involved in the care of this patient            Alcide Evener, MD  06/12/21 [phone]

## 2021-09-16 ENCOUNTER — Emergency Department
Admission: EM | Admit: 2021-09-16 | Discharge: 2021-09-16 | Disposition: A | Discharge status: Home / Self Care | Payer: Medicaid HMO | Attending: Emergency Medicine | Admitting: Emergency Medicine

## 2021-09-16 ENCOUNTER — Emergency Department: Payer: Medicaid HMO

## 2021-09-16 DIAGNOSIS — L02414 Cutaneous abscess of left upper limb: Secondary | ICD-10-CM

## 2021-09-16 DIAGNOSIS — L02412 Cutaneous abscess of left axilla: Secondary | ICD-10-CM | POA: Insufficient documentation

## 2021-09-16 DIAGNOSIS — F17210 Nicotine dependence, cigarettes, uncomplicated: Secondary | ICD-10-CM | POA: Insufficient documentation

## 2021-09-16 DIAGNOSIS — N6002 Solitary cyst of left breast: Secondary | ICD-10-CM | POA: Insufficient documentation

## 2021-09-16 DIAGNOSIS — M545 Low back pain, unspecified: Secondary | ICD-10-CM | POA: Insufficient documentation

## 2021-09-16 DIAGNOSIS — L02421 Furuncle of right axilla: Secondary | ICD-10-CM | POA: Insufficient documentation

## 2021-09-16 LAB — URINALYSIS REFLEX TO MICROSCOPIC EXAM - REFLEX TO CULTURE
Bilirubin, UA: NEGATIVE
Glucose, UA: NEGATIVE
Leukocyte Esterase, UA: NEGATIVE
Nitrite, UA: NEGATIVE
Protein, UR: 30 — AB
Specific Gravity UA: 1.025 (ref 1.001–1.035)
Urine pH: 7 (ref 5.0–8.0)
Urobilinogen, UA: 4 mg/dL — AB

## 2021-09-16 LAB — URINE HCG QUALITATIVE: Urine HCG Qualitative: NEGATIVE

## 2021-09-16 MED ORDER — NAPROXEN 500 MG PO TABS
500.0000 mg | ORAL_TABLET | Freq: Two times a day (BID) | ORAL | 0 refills | Status: AC
Start: 2021-09-16 — End: ?

## 2021-09-16 MED ORDER — DOXYCYCLINE HYCLATE 100 MG PO TABS
100.0000 mg | ORAL_TABLET | Freq: Two times a day (BID) | ORAL | 0 refills | Status: AC
Start: 2021-09-16 — End: 2021-09-23

## 2021-09-16 MED ORDER — NAPROXEN 250 MG PO TABS
500.0000 mg | ORAL_TABLET | Freq: Once | ORAL | Status: AC
Start: 2021-09-16 — End: 2021-09-16
  Administered 2021-09-16: 500 mg via ORAL
  Filled 2021-09-16: qty 2

## 2021-09-16 NOTE — ED Provider Notes (Signed)
[facility] EMERGENCY DEPARTMENT H&P         CLINICAL INFORMATION        HPI:      Chief Complaint: Flank Pain and Cyst  .    Susan Olsen is a 31 y.o. female significant past medical history including hypertension, diabetes; presents with persistent moderate lower right back pain onset 2 days ago.  Pain worsened with laying back.  Has been applying heating pad with no relief.  No radiation of pain down leg.    Also here with secondary complaint of "boil" under left arm and a second one on lateral aspect of left breast.    No fever, dysuria.  Does endorse urinary frequency with hydrochlorothiazide, however no other urinary symptoms otherwise.    PCP: [facility] [employer] ([place])    History obtained from: patient, family, review of prior chart          ROS:      Positive and negative ROS elements as per HPI.  All other systems reviewed and negative.      Physical Exam:      Pulse 81  BP (!) 146/96  Resp 18  SpO2 99 %  Temp 97.6 F (36.4 C)    Physical Exam  Vitals and nursing note reviewed.   Constitutional:       Appearance: Normal appearance. She is well-developed.   HENT:      Head: Normocephalic and atraumatic.   Eyes:      Extraocular Movements: Extraocular movements intact.      Conjunctiva/sclera: Conjunctivae normal.      Pupils: Pupils are equal, round, and reactive to light.   Cardiovascular:      Rate and Rhythm: Normal rate and regular rhythm.      Heart sounds: Normal heart sounds.   Pulmonary:      Effort: Pulmonary effort is normal.      Breath sounds: Normal breath sounds.   Abdominal:      General: Bowel sounds are normal.      Palpations: Abdomen is soft.      Tenderness: There is no abdominal tenderness.   Musculoskeletal:         General: Normal range of motion.      Cervical back: Normal range of motion and neck supple.      Right lower leg: No edema.      Left lower leg: No edema.      Comments: Minimally tender right lumbar paraspinal neurovascular intact 5/5  strength   Skin:     General: Skin is warm and dry.      Comments: 1 cm spontaneously draining abscess left axilla   Neurological:      General: No focal deficit present.      Mental Status: She is alert and oriented to person, place, and time.      Deep Tendon Reflexes: Reflexes are normal and symmetric.   Psychiatric:         Mood and Affect: Mood normal.         Thought Content: Thought content normal.                  PAST HISTORY        Primary Care Provider: Patsy Lager, MD        PMH/PSH:    .     Past Medical History:   Diagnosis Date    Diabetes mellitus     Hypertension  She has no past surgical history on file.      Social/Family History:      She reports that she has been smoking cigarettes. She does not have any smokeless tobacco history on file. She reports that she does not currently use alcohol. She reports that she does not use drugs.    History reviewed. No pertinent family history.      Listed Medications on Arrival:    .     Home Medications               dicyclomine (BENTYL) 10 MG capsule     Take 1 capsule (10 mg) by mouth every 6 (six) hours as needed (pain)     furosemide (LASIX) 20 MG tablet     Take 1 tablet (20 mg) by mouth 2 (two) times daily     metFORMIN (GLUCOPHAGE) 1000 MG tablet     Take 1 tablet (1,000 mg) by mouth     ondansetron (ZOFRAN-ODT) 4 MG disintegrating tablet     Take 1 tablet (4 mg) by mouth every 6 (six) hours as needed for Nausea           Allergies: She has No Known Allergies.            VISIT INFORMATION        Clinical Course in the ED:             Medications Given in the ED:    .     ED Medication Orders (From admission, onward)      Start Ordered     Status Ordering Provider    09/16/21 1333 09/16/21 1332  naproxen (NAPROSYN) tablet 500 mg  Once        Route: Oral  Ordered Dose: 500 mg       Last MAR action: Given Hanson Medeiros J              Procedures:      Procedures      Interpretations:      O2 Sat:  The patient's oxygen saturation was 99 % on room  air. This was independently interpreted by me as Normal.                RESULTS        Lab Results:      Results       Procedure Component Value Units Date/Time    Urinalysis Reflex to Microscopic Exam- Reflex to Culture [[id number]]  (Abnormal) Collected: 09/16/21 1325     Updated: 09/16/21 1333     Urine Type Urine, Clean Ca     Color, UA Yellow     Clarity, UA Cloudy     Specific Gravity UA 1.025     Urine pH 7.0     Leukocyte Esterase, UA Negative     Nitrite, UA Negative     Protein, UR 30     Glucose, UA Negative     Ketones UA Trace     Urobilinogen, UA 4.0 mg/dL      Bilirubin, UA Negative     Blood, UA Large     RBC, UA TNTC /hpf      WBC, UA 6 - 10 /hpf      Squamous Epithelial Cells, Urine 0 - 5 /hpf     Urine HCG Qualitative [[id number]] Collected: 09/16/21 1325    Specimen: Urine Updated: 09/16/21 1333     Urine HCG Qualitative Negative  Radiology Results:      Lumbar Spine AP and Lateral   Final Result      1.Early degenerative changes are present, including disc height loss at L5-S1 and mild endplate spurring throughout the lumbar spine.   2.Otherwise unremarkable exam.      Trilby Drummer, MD   09/16/2021 2:16 PM                 Visit date: 09/16/2021      CLINICAL SUMMARY          Diagnosis:    .     Final diagnoses:   Lumbar back pain   Cyst (solitary) of breast, left   Cutaneous abscess of left upper extremity         MDM Notes:    Medical Decision Making  Amount and/or Complexity of Data Reviewed  Labs: ordered.  Radiology: ordered.    Risk  Prescription drug management.         I am the first provider for this patient.  I reviewed the vital signs, nursing notes, past medical history, past surgical history, family history and social history.  I have reviewed the patient's previous charts.    31 year old back pain.  X-rays mild degenerative changes.  Will prescribe naproxen follow-up [place] spine as needed.  Neuro intact.  Also abscess left axilla that spontaneously drained.  Will treat with  doxycycline.         Disposition:      Discharge         Discharge Prescriptions       Medication Sig Dispense Auth. Provider    naproxen (NAPROSYN) 500 MG tablet Take 1 tablet (500 mg) by mouth 2 (two) times daily with meals 20 tablet Hazel Leveille, Sheria Lang, MD    doxycycline (VIBRA-TABS) 100 MG tablet Take 1 tablet (100 mg) by mouth 2 (two) times daily for 7 days 14 tablet Nashay Brickley, Sheria Lang, MD    doxycycline (VIBRA-TABS) 100 MG tablet Take 1 tablet (100 mg) by mouth 2 (two) times daily for 7 days 14 tablet Ziya Coonrod, Sheria Lang, MD    naproxen (NAPROSYN) 500 MG tablet Take 1 tablet (500 mg) by mouth 2 (two) times daily with meals 20 tablet Jinnifer Montejano, Sheria Lang, MD                    Scribe Attestation:      I was acting as a Neurosurgeon for Samuel Bouche, MD on [facility]  Treatment Team: Scribe: Gery Pray     Treatment Team: Scribe: Gery Pray is scribing for me on [facility]. This note and the patient instructions accurately reflect work and decisions made by me.  Samuel Bouche, MD                  Samuel Bouche, MD  09/20/21 [phone]

## 2021-09-16 NOTE — Discharge Instructions (Signed)
Dear Ms. Janee Morn:    Thank you for choosing one of [facility] emergency departments.  I hope your visit today was EXCELLENT.      IF YOU DO NOT CONTINUE TO IMPROVE OR YOUR CONDITION WORSENS, PLEASE CONTACT YOUR DOCTOR OR RETURN IMMEDIATELY TO THE EMERGENCY DEPARTMENT.    Sincerely,  Mahaney, Sheria Lang, MD  Attending Emergency Physician  [facility] Emergency Department    OBTAINING A PRIMARY CARE APPOINTMENT    Primary care physicians (PCPs, also known as primary care doctors) are either internists or family medicine doctors. Both types of PCPs focus on health promotion, disease prevention, patient education and counseling, and treatment of acute and chronic medical conditions.    Call for an appointment with a primary care doctor.  Ask to see who is taking new patients.     [facility]  telephone:  [phone]  [email or web address]    For a pediatrician, call the [facility] referral line below.  You can also call to make an appointment at [facility] for Children (except [employer] and [facility]):    [address]  [place], [place] [address]  [phone]    Valentina Lucks  Call [phone] (available 24 hours a day, 7 days a week) if you need any further referrals and we can help you find a primary care doctor or specialist.  Also, available online at:  [email or web address]    For more information regarding our services at [facility], please call the number above or visit the website [email or web address]    YOUR CONTACT INFORMATION  Before leaving please check with registration to make sure we have an up-to-date contact number.  You can call registration at [phone], Option 7 [facility] location) or [phone], Option 1 [facility] location) to update your information.  For questions about your hospital bill, please call [phone].  For questions about your Emergency Dept Physician bill please call  [phone].      FREE HEALTH SERVICES  If you need help with health or social services, please call 2-1-1 for a free referral to resources in your area.  2-1-1 is a free service connecting people with information on health insurance, free clinics, pregnancy, mental health, dental care, food assistance, housing, and substance abuse counseling.  Also, available online at:  http://www.[place].org    ORTHOPEDIC INJURY   Please know that significant injuries can exist even when an initial x-ray is read as normal or negative.  This can occur because some fractures (broken bones) are not initially visible on x-rays.  For this reason, close outpatient follow-up with your primary care doctor or bone specialist (orthopedist) is required.    MEDICATIONS AND FOLLOWUP  Please be aware that some prescription medications can cause drowsiness.  Use caution when driving or operating machinery.    The examination and treatment you have received in our Emergency Department is provided on an emergency basis, and is not intended to be a substitute for your primary care physician.  It is important that your doctor checks you again and that you report any new or remaining problems at that time.        ASSISTANCE WITH INSURANCE    Affordable Care Act  [facility])  Call to start or finish an application, compare plans, enroll or ask a question.  [phone]  TTY: [phone]  Web:  Healthcare.gov    Help Enrolling in [facility]  Cover [place]  [phone] (TOLL-FREE)  [phone] (TTY)  Web:  Http://www.coverva.org    Local Help Enrolling in the [place]  [phone] (MAIN)  Email:  health-help@nvfs .org  Web:  http://lewis-perez.info/  Address:  [address], [address] [place], [place] [address]    SEDATING MEDICATIONS  Sedating medications include strong pain medications (e.g. narcotics), muscle relaxers, benzodiazepines (used for anxiety and as muscle relaxers), Benadryl/diphenhydramine and other  antihistamines for allergic reactions/itching, and other medications.  If you are unsure if you have received a sedating medication, please ask your physician or nurse.  If you received a sedating medication: DO NOT drive a car. DO NOT operate machinery. DO NOT perform jobs where you need to be alert.  DO NOT drink alcoholic beverages while taking this medicine.     If you get dizzy, sit or lie down at the first signs. Be careful going up and down stairs.  Be extra careful to prevent falls.     Never give this medicine to others.     Keep this medicine out of reach of children.     Do not take or save old medicines. Throw them away when outdated.     Keep all medicines in a cool, dry place. DO NOT keep them in your bathroom medicine cabinet or in a cabinet above the stove.    MEDICATION REFILLS  Please be aware that we cannot refill any prescriptions through the ER. If you need further treatment from what is provided at your ER visit, please follow up with your primary care doctor or your pain management specialist.

## 2022-02-14 ENCOUNTER — Emergency Department: Payer: Medicaid HMO

## 2022-02-14 ENCOUNTER — Emergency Department
Admission: EM | Admit: 2022-02-14 | Discharge: 2022-02-14 | Disposition: A | Discharge status: Home / Self Care | Payer: Medicaid HMO | Attending: Residents | Admitting: Residents

## 2022-02-14 ENCOUNTER — Emergency Department
Admission: EM | Admit: 2022-02-14 | Discharge: 2022-02-14 | Disposition: A | Discharge status: Home / Self Care | Payer: Medicaid (Managed Care) | Attending: Emergency Medicine | Admitting: Emergency Medicine

## 2022-02-14 DIAGNOSIS — R079 Chest pain, unspecified: Secondary | ICD-10-CM

## 2022-02-14 DIAGNOSIS — M25511 Pain in right shoulder: Secondary | ICD-10-CM | POA: Insufficient documentation

## 2022-02-14 DIAGNOSIS — Z5321 Procedure and treatment not carried out due to patient leaving prior to being seen by health care provider: Secondary | ICD-10-CM | POA: Insufficient documentation

## 2022-02-14 DIAGNOSIS — M6520 Calcific tendinitis, unspecified site: Secondary | ICD-10-CM | POA: Insufficient documentation

## 2022-02-14 LAB — ECG 12-LEAD
QRS Duration: 78 ms
QTC Calculation (Bezet): 430 ms
R Axis: 70 degrees
T Axis: 75 degrees
Ventricular Rate: 69 {beats}/min

## 2022-02-14 MED ORDER — KETOROLAC TROMETHAMINE 30 MG/ML IJ SOLN
30.0000 mg | Freq: Once | INTRAMUSCULAR | Status: AC
Start: 2022-02-14 — End: 2022-02-14
  Administered 2022-02-14: 30 mg via INTRAMUSCULAR
  Filled 2022-02-14: qty 1

## 2022-02-14 NOTE — ED Provider Notes (Signed)
[facility] EMERGENCY DEPARTMENT RESIDENT H&P       HISTORY OF PRESENT ILLNESS      Chief Complaint: Arm pain    HPI: Susan Olsen is a 31 y.o. female with PMHx hypertension, diabetes presenting with right arm pain that started today after she woke up.  Patient denies any falls, trauma to the area.  Denies any overuse of the right arm yesterday.  Denies any pain in the hand or wrist.  Denies any numbness or tingling running down her arm.  Denies chest pain shortness of breath.    History obtained from: patient    ROS:  Review of Systems          PREVIOUS HISTORY    Primary Care Provider: Patsy Lager, MD    Past Medical History:   Diagnosis Date    Diabetes mellitus     Hypertension        History reviewed. No pertinent surgical history.    Social History     Tobacco Use    Smoking status: Every Day     Types: Cigarettes   Vaping Use    Vaping Use: Never used   Substance Use Topics    Alcohol use: Not Currently    Drug use: Never       Social History     Tobacco Use    Smoking status: Every Day     Types: Cigarettes   Vaping Use    Vaping Use: Never used   Substance Use Topics    Alcohol use: Not Currently    Drug use: Never       History reviewed. No pertinent family history.    No Known Allergies    Home Medications       Med List Status: Complete Set By: William Hamburger, RN at 02/14/2022  2:43 PM              dicyclomine (BENTYL) 10 MG capsule     Take 1 capsule (10 mg) by mouth every 6 (six) hours as needed (pain)     furosemide (LASIX) 20 MG tablet     Take 1 tablet (20 mg) by mouth 2 (two) times daily     metFORMIN (GLUCOPHAGE) 1000 MG tablet     Take 1 tablet (1,000 mg) by mouth     naproxen (NAPROSYN) 500 MG tablet     Take 1 tablet (500 mg) by mouth 2 (two) times daily with meals     naproxen (NAPROSYN) 500 MG tablet     Take 1 tablet (500 mg) by mouth 2 (two) times daily with meals     ondansetron (ZOFRAN-ODT) 4 MG disintegrating tablet     Take 1 tablet (4 mg) by  mouth every 6 (six) hours as needed for Nausea                  PHYSICAL EXAM    Pulse 82  BP 160/77  Resp 18  SpO2 98 %  Temp 98 F (36.7 C)    Physical Exam  Constitutional:       Appearance: Normal appearance.   HENT:      Head: Normocephalic and atraumatic.   Cardiovascular:      Rate and Rhythm: Normal rate and regular rhythm.      Pulses: Normal pulses.      Heart sounds: Normal heart sounds.   Pulmonary:      Effort: Pulmonary effort is normal.  Breath sounds: Normal breath sounds.   Musculoskeletal:      Comments: Patient with mild tenderness in the right shoulder joint.  She has limited passive range of motion of the shoulder in all planes.  Tenderness of the right elbow.  Mildly limited range of motion.  Full range of motion and strength of her right wrist.  Sensation and pulses intact bilaterally in her upper extremities   Neurological:      Mental Status: She is alert.              RESULTS    Labs Reviewed - No data to display    Elbow Right AP Lateral and Obliques   Final Result      1.Normal radiographs of the right elbow.      Carla Drape, MD   02/14/2022 3:40 PM      Shoulder Right 2+ Views   Final Result      1.No evidence of acute fracture or dislocation in the right shoulder.      2.Faint calcification adjacent to the humeral head, which may represent   rotator cuff calcific tendinopathy.      Carla Drape, MD   02/14/2022 3:40 PM          The laboratory results, imaging results and other diagnostic exam results were reviewed in the HPI        ED COURSE AND MEDICAL DECISION MAKING    Given patient's history of diabetes, hypertension we will order an EKG.    Patient's pain appears to be musculoskeletal in nature however we will get x-rays to rule out fracture.             Medications given in ED:   Medications   ketorolac (TORADOL) injection 30 mg (30 mg Intramuscular Given 02/14/22 1514)               ASSESSMENT AND PLAN    31 year old female presenting with acute right arm pain, found to  have a calcific tendinitis of the right shoulder.  Patient advised to take NSAIDs and Tylenol around-the-clock while she is having acute pain.  Stable for discharge home with Ortho follow-up.    1. Calcific tendinitis        2. Acute pain of right shoulder          Dispo: Home      Signed by:  Cherly Hensen, MD PGY1  Case discussed with Dr. Shelva Majestic (Attending physician)      Note: parts of this note were generated by the Epic EMR system/ Dragon speech recognition and may contain inherent errors or omissions not intended by the user. Grammatical errors, random word insertions, deletions, pronoun errors and incomplete sentences are occasional consequences of this technology due to software limitations. Not all errors are caught or corrected. If there are questions or concerns about the content of this note or information contained within the body of this dictation they should be addressed directly with the author for clarification.      Cherly Hensen, MD  Resident  02/14/22 [phone]

## 2022-02-14 NOTE — Discharge Instructions (Addendum)
Please take 650 milligrams of acetaminophen (Tylenol) and 600 milligrams ibuprofen (Motrin) every 8 hours for your pain. Take this regimen on a schedule for the next 2-3 days. You can also use a pain patch like lidocaine 4% patches (Salonpas) every 12 hours. Do not use more than one patch at a time.

## 2022-02-14 NOTE — ED Provider Notes (Signed)
[facility] EMERGENCY DEPARTMENT  ATTENDING PHYSICIAN ATTESTATION NOTE     Patient Name: Susan Olsen, Susan Olsen  Encounter Date:  02/14/2022  Attending Physician: Berlin Hun, DO   Room:  7/M 7  Patient DOB:  Oct 28, 1990  Age: 31 y.o. female  MRN:  [id number]  PCP: Patsy Lager, MD         Diagnosis/Disposition:     Final Impression  Final diagnoses:   Calcific tendinitis   Acute pain of right shoulder     Disposition  ED Disposition       ED Disposition   Discharge    Condition   --    Date/Time   Sat Feb 14, 2022  3:56 PM    Comment   Marko Stai discharge to home/self care.    Condition at disposition: Stable               Follow up  [facility]  [address]  [place] [address]  [phone]  In 1 week  For follow up    Prescriptions  New Prescriptions    No medications on file           MDM:        Nursing Triage note: ER arm pain (from elbow to shoulder) since yesterday. Denies any injury/trauma or fall.    Initial Assessment and Differential Diagnosis:  Susan Olsen is a 31 y.o. female history of diabetes and hypertension presenting with right arm pain.  Began today after waking up.  Denies any trauma to the area.  No new numbness tingling or weakness.  Denies neck chest or back pain.    Exam:  Physical Exam  HENT:      Right Ear: External ear normal.      Left Ear: External ear normal.      Nose: Nose normal.   Eyes:      Conjunctiva/sclera: Conjunctivae normal.   Pulmonary:      Effort: Pulmonary effort is normal.   Abdominal:      General: There is no distension.      Palpations: Abdomen is soft.   Musculoskeletal:         General: Tenderness present. No swelling or deformity.      Cervical back: Neck supple. No tenderness.   Skin:     General: Skin is warm and dry.   Neurological:      Mental Status: She is alert.   Psychiatric:         Mood and Affect: Mood normal.         Behavior: Behavior normal.         Initial  Differential Diagnosis:  Initial differential diagnosis to include but not limited to: fracture, sprain, bruising, ligamentous injury, tendon injury, muscle injury, dislocation, frozen shoulder, calcific tendinitis    Plan:  EKG, x-ray of areas of pain    Final Impression:  Calcific tendinitis  The patient presented with a low mechanism musculoskeletal injury without obvious fracture or dislocation on X-Ray. No evidence of compartment syndrome on exam. We discussed the continued possibility of joint, cartilaginous, muscular, or ligamentous injury. We also discussed the possibility of X-Ray negative fracture.  EKG performed given obesity, diabetes, hypertension history however low suspicion for cardiac disease.  See EKG interpretation below.  She was advised to use a sling/wrap as appropriate, to use ice for swelling and ibuprofen/acetaminophen for pain.   The patient was deemed stable for discharge. They  were given strict return precautions as it relates to their presumed diagnosis, verbalized understanding of these precautions and agreed to follow up as instructed. All questions were answered prior to discharge.         Medical Decision Making  Amount and/or Complexity of Data Reviewed  ECG/medicine tests: ordered.                             Interpretations, Clinical Decision Tools and Critical Care:          EKG Interpretation  Interpreted by: Berlin HunWest, Dylan Monforte D, DO at 4:17 PM on 02/14/22  Rate: 69  Rhythm: sinus rhythm  Axis: Normal  ST Segments: No acute ST segment changes  T waves: No acute T Wave changes        Procedures:   Procedures        ATTESTATIONS     Scribe Attestation: There was no scribe involved in the care of this patient.     Documentation Notes:  Parts of this note were generated by the Epic EMR system/ Dragon speech recognition and may contain inherent errors or omissions not intended by the user. Grammatical errors, random word insertions, deletions, pronoun errors and incomplete sentences are  occasional consequences of this technology due to software limitations. Not all errors are caught or corrected.  My documentation is often completed after the patient is no longer under my clinical care. In some cases, the Epic EMR may pull updated results into the above documentation which may not reflect all results or information that were available to me at the time of my medical decision making.   If there are questions or concerns about the content of this note or information contained within the body of this dictation they should be addressed directly with the author for clarification.    Midlevel Provider Attestation:   The patient was seen and examined by the mid-level (resident, physician assistant or nurse practitioner) and the plan of care was discussed with me. I agree with the plan as it was presented to me.  I have reviewed and agree with the final ED diagnosis.   Please see the separately documented midlevel note for additional information including but not limited to full history of present illness, review of systems and comprehensive physical exam.

## 2022-02-15 LAB — ECG 12-LEAD
Atrial Rate: 69 {beats}/min
IHS MUSE NARRATIVE AND IMPRESSION: NORMAL
P Axis: 34 degrees
P-R Interval: 144 ms
Q-T Interval: 402 ms

## 2022-03-22 ENCOUNTER — Emergency Department
Admission: EM | Admit: 2022-03-22 | Discharge: 2022-03-22 | Disposition: A | Discharge status: Home / Self Care | Payer: BC Managed Care – PPO | Attending: Emergency Medicine | Admitting: Emergency Medicine

## 2022-03-22 DIAGNOSIS — R110 Nausea: Secondary | ICD-10-CM | POA: Insufficient documentation

## 2022-03-22 DIAGNOSIS — R519 Headache, unspecified: Secondary | ICD-10-CM | POA: Insufficient documentation

## 2022-03-22 DIAGNOSIS — U071 COVID-19: Secondary | ICD-10-CM | POA: Insufficient documentation

## 2022-03-22 LAB — COVID-19 (SARS-COV-2) & INFLUENZA  A/B, NAA (ROCHE LIAT)
Influenza A: NOT DETECTED
Influenza B: NOT DETECTED
SARS-CoV-2 Overall Result: DETECTED — AB

## 2022-03-22 LAB — GROUP A STREP, RAPID ANTIGEN: Group A Strep, Rapid Antigen: NEGATIVE

## 2022-03-22 MED ORDER — DEXTROMETHORPHAN-GUAIFENESIN ER 30-600 MG PO TB12
1.0000 | ORAL_TABLET | Freq: Two times a day (BID) | ORAL | 0 refills | Status: AC
Start: 2022-03-22 — End: ?

## 2022-03-22 MED ORDER — OSELTAMIVIR PHOSPHATE 75 MG PO CAPS
75.0000 mg | ORAL_CAPSULE | Freq: Once | ORAL | Status: DC
Start: 2022-03-22 — End: 2022-03-22

## 2022-03-22 MED ORDER — ONDANSETRON 4 MG PO TBDP
4.0000 mg | ORAL_TABLET | Freq: Once | ORAL | Status: AC
Start: 2022-03-22 — End: 2022-03-22
  Administered 2022-03-22: 4 mg via ORAL
  Filled 2022-03-22: qty 1

## 2022-03-22 MED ORDER — DEXTROMETHORPHAN-GUAIFENESIN ER 30-600 MG PO TB12
1.0000 | ORAL_TABLET | Freq: Once | ORAL | Status: AC
Start: 2022-03-22 — End: 2022-03-22
  Administered 2022-03-22: 1 via ORAL
  Filled 2022-03-22: qty 1

## 2022-03-22 MED ORDER — MELOXICAM 7.5 MG PO TABS
7.5000 mg | ORAL_TABLET | Freq: Once | ORAL | Status: AC
Start: 2022-03-22 — End: 2022-03-22
  Administered 2022-03-22: 7.5 mg via ORAL
  Filled 2022-03-22: qty 1

## 2022-03-22 MED ORDER — DEXAMETHASONE SODIUM PHOSPHATE 10 MG/ML IJ SOLN (WRAP) - FOR ORAL USE
10.0000 mg | Freq: Once | INTRAMUSCULAR | Status: AC
Start: 2022-03-22 — End: 2022-03-22
  Administered 2022-03-22: 10 mg via ORAL
  Filled 2022-03-22: qty 1

## 2022-03-22 MED ORDER — ALBUTEROL SULFATE HFA 108 (90 BASE) MCG/ACT IN AERS
2.0000 | INHALATION_SPRAY | RESPIRATORY_TRACT | 0 refills | Status: AC | PRN
Start: 2022-03-22 — End: ?

## 2022-03-22 MED ORDER — BENZONATATE 200 MG PO CAPS
200.0000 mg | ORAL_CAPSULE | Freq: Three times a day (TID) | ORAL | 0 refills | Status: AC | PRN
Start: 2022-03-22 — End: ?

## 2022-03-22 MED ORDER — BENZONATATE 100 MG PO CAPS
200.0000 mg | ORAL_CAPSULE | Freq: Once | ORAL | Status: AC
Start: 2022-03-22 — End: 2022-03-22
  Administered 2022-03-22: 200 mg via ORAL
  Filled 2022-03-22: qty 2

## 2022-03-22 MED ORDER — MELOXICAM 7.5 MG PO TABS
7.5000 mg | ORAL_TABLET | Freq: Every day | ORAL | 0 refills | Status: AC | PRN
Start: 2022-03-22 — End: ?

## 2022-04-01 ENCOUNTER — Emergency Department
Admission: EM | Admit: 2022-04-01 | Discharge: 2022-04-01 | Disposition: A | Discharge status: Home / Self Care | Payer: BC Managed Care – PPO | Attending: Emergency Medicine | Admitting: Emergency Medicine

## 2022-04-01 DIAGNOSIS — H1033 Unspecified acute conjunctivitis, bilateral: Secondary | ICD-10-CM

## 2022-04-01 DIAGNOSIS — R059 Cough, unspecified: Secondary | ICD-10-CM | POA: Insufficient documentation

## 2022-04-01 MED ORDER — OFLOXACIN 0.3 % OP SOLN
OPHTHALMIC | 0 refills | Status: AC
Start: 2022-04-01 — End: 2022-04-08

## 2022-04-01 MED ORDER — OFLOXACIN 0.3 % OP SOLN
2.0000 [drp] | Freq: Once | OPHTHALMIC | Status: AC
Start: 2022-04-01 — End: 2022-04-01
  Administered 2022-04-01: 2 [drp] via OPHTHALMIC
  Filled 2022-04-01: qty 5

## 2022-04-01 NOTE — Discharge Instructions (Signed)
No contact lenses until your infection is cleared!

## 2022-04-01 NOTE — ED Provider Notes (Signed)
EMERGENCY DEPARTMENT NOTE     Patient initially seen and examined at   ED PHYSICIAN ASSIGNED       None           ED MIDLEVEL (APP) ASSIGNED       Date/Time Event User Comments    04/01/22 [phone] PA/NP Provider Assigned [place], [place], [place] assigned as Physician Assistant            HISTORY OF PRESENT ILLNESS   {Translator Used (Optional):59393}    Chief Complaint: Eye Drainage       32 y.o. female with past medical history as below c/o b/l eye irritation and drainage onset this     [place] (other than patient): {Historian:59024}  Additional History Provided by Independent Historian:  MEDICAL HISTORY     Past Medical History:  No past medical history on file.    Past Surgical History:  No past surgical history on file.    Social History:  Social History     Socioeconomic History    Marital status: Single   Tobacco Use    Smoking status: Never    Smokeless tobacco: Never   Vaping Use    Vaping Use: Never used   Substance and Sexual Activity    Alcohol use: Yes    Drug use: Never     Social Determinants of Health     Food Insecurity: No Food Insecurity (04/01/2022)    Hunger Vital Sign     Worried About Running Out of Food in the Last Year: Never true     Ran Out of Food in the Last Year: Never true   Transportation Needs: No Transportation Needs (03/22/2022)    PRAPARE - Armed forces logistics/support/administrative officer (Medical): No     Lack of Transportation (Non-Medical): No   Intimate Partner Violence: Not At Risk (04/01/2022)    Humiliation, Afraid, Rape, and Kick questionnaire     Fear of Current or Ex-Partner: No     Emotionally Abused: No     Physically Abused: No     Sexually Abused: No   Housing Stability: Unknown (03/22/2022)    Housing Stability Vital Sign     Unstable Housing in the Last Year: No       Family History:  No family history on file.    Outpatient Medication:  Previous Medications    ALBUTEROL SULFATE HFA (PROVENTIL) 108 (90 BASE) MCG/ACT INHALER    Inhale 2 puffs into the lungs  every 4 (four) hours as needed for Wheezing or Shortness of Breath (wheezing) Dispense with spacer    BENZONATATE (TESSALON) 200 MG CAPSULE    Take 1 capsule (200 mg) by mouth 3 (three) times daily as needed for Cough    DEXTROMETHORPHAN-GUAIFENESIN (MUCINEX DM) 30-600 MG PER 12 HR TABLET    Take 1 tablet by mouth every 12 (twelve) hours    MELOXICAM (MOBIC) 7.5 MG TABLET    Take 1 tablet (7.5 mg) by mouth daily as needed for Pain (body aches) Take with food.         REVIEW OF SYSTEMS   Review of Systems See History of Present Illness  PHYSICAL EXAM     ED Triage Vitals [04/01/22 0955]   Enc Vitals Group      BP 135/76      Heart Rate 82      Resp Rate 19      Temp 97.9 F (36.6 C)  Temp Source Oral      SpO2 97 %      Weight       Height       Head Circumference       Peak Flow       Pain Score 0      Pain Loc       Pain Edu?       Excl. in [place]?      Physical Exam   ***  MEDICAL DECISION MAKING     PRIMARY PROBLEM LIST      {CEP ACUITY:59028} DIAGNOSIS:***  {Chronic Illness Impacting Care of the above problem:59030} {Explain (Optional):59078}  {Differential Diagnosis:59053}    DISCUSSION      ***    {If patient is being hospitalized is severe sepsis or septic shock suspected?:59467}      {Was management discussed with a consultant?:59037}  {Was the decision around the need for surgery discussed with consultant:59056::"N/A"}  {External Records Reviewed?:59023}    Additional Notes    {Diagnostic test considered and not performed:59031::"N/A"}  {Prescription medications considered and not given:59033::"N/A"}  {Hospitalization considered but not done:59032::"N/A"}  {Social Determinants of Health Considerations:59036::"N/A"}  {Was there decision to not resuscitate or to de-escalate care due to poor prognosis?:59057}         Vital Signs: Reviewed the patient's vital signs.   Nursing Notes: Reviewed and utilized available nursing notes.  Medical Records Reviewed: Reviewed available past medical records.  Counseling:  The emergency provider has spoken with the patient and discussed today's findings, in addition to providing specific details for the plan of care.  Questions are answered and there is agreement with the plan.      MIPS DOCUMENTATION    {ORAL ANTIBIOTICS given for otitis externa due to:59079}  {ACUTE BRONCHITIS Medical reasoning for giving this patient oral antibiotics for acute bronchitis are the following (Optional):59080}  {Medical reasoning for giving this patient oral antibiotics for acute sinusitis are the following (Optional):59081}  {HEAD TRAUMA Indications for head CT due to trauma (Optional):59082}    CARDIAC STUDIES    The following cardiac studies were independently interpreted by me the Emergency Medicine Provider.  For full cardiac study results please see chart.    {Monitor Strip Interpretation:59688}  [id number]  {Rhythm:59687}  {ST segments:59689}    {EKG interpretation:59684}  {Comparison:59859}  {Time:64077}  {Rate:59685}  {Rhythm:59687}  {ST segments:59689}  {STEMI?:64073}  {EKG interpretation:59690}    {EKG interpretation:59684}  {Comparison:59859}  {Time:64077}  {Rate:59685}  {Rhythm:59687}  {ST segments:59689}  {STEMI?:64073}  {EKG interpretation:59690}  EMERGENCY IMAGING STUDIES    The following imagine studies were independently interpreted by me (emergency medicine provider):    {Xray interpreted by ED provider? (Optional):59468} {SIDE (Optional):59475}  {Comparison:59859}  {RESULT:59469}  {IMPRESSION:59470}    {CT interpreted by provider? (Optional):59471}  {Comparison:59859}  {RESULT:59473}  {IMPRESSION:59474}  RADIOLOGY IMAGING STUDIES      No orders to display       EMERGENCY DEPT. MEDICATIONS      ED Medication Orders (From admission, onward)      None            LABORATORY RESULTS    Ordered and independently interpreted AVAILABLE laboratory tests.   Results       ** No results found for the last 24 hours. **              CRITICAL CARE/PROCEDURES    Procedures  ***Critical  care?  DIAGNOSIS      Diagnosis:  Final diagnoses:  None       Disposition:  ED Disposition       None            Prescriptions:  Patient's Medications   New Prescriptions    No medications on file   Previous Medications    ALBUTEROL SULFATE HFA (PROVENTIL) 108 (90 BASE) MCG/ACT INHALER    Inhale 2 puffs into the lungs every 4 (four) hours as needed for Wheezing or Shortness of Breath (wheezing) Dispense with spacer    BENZONATATE (TESSALON) 200 MG CAPSULE    Take 1 capsule (200 mg) by mouth 3 (three) times daily as needed for Cough    DEXTROMETHORPHAN-GUAIFENESIN (MUCINEX DM) 30-600 MG PER 12 HR TABLET    Take 1 tablet by mouth every 12 (twelve) hours    MELOXICAM (MOBIC) 7.5 MG TABLET    Take 1 tablet (7.5 mg) by mouth daily as needed for Pain (body aches) Take with food.   Modified Medications    No medications on file   Discontinued Medications    No medications on file           This note was generated by the Epic EMR system/ Dragon speech recognition and may contain inherent errors or omissions not intended by the user. Grammatical errors, random word insertions, deletions and pronoun errors  are occasional consequences of this technology due to software limitations. Not all errors are caught or corrected. If there are questions or concerns about the content of this note or information contained within the body of this dictation they should be addressed directly with the author for clarification.    {THIS REVIEW SECTION WILL AUTODELETE ONCE NOTE IS SIGNED    END REVIEW SECTION(Optional):55325}

## 2022-04-01 NOTE — ED Notes (Signed)
EMERGENCY DEPARTMENT ATTENDING PHYSICIAN NOTE     I performed the substantive portion of the visit. Medical decision-making: For the problems addressed, I personally developed, reviewed, and/or approved the plan and assessment as documented by the APP. Patient was seen by APP, see their note for documentation of history and exam.    BRIEF HISTORY OF PRESENT ILLNESS AND ADDITIONAL EXAM FINDINGS     Chief Complaint: Eye Drainage       History per APP    32 y.o. female presents with eye drainage    Triage Vitals:  ED Triage Vitals [04/01/22 0955]   Enc Vitals Group      BP 135/76      Heart Rate 82      Resp Rate 19      Temp 97.9 F (36.6 C)      Temp Source Oral      SpO2 97 %      Weight       Height       Head Circumference       Peak Flow       Pain Score 0      Pain Loc       Pain Edu?       Excl. in [place]?             MEDICAL DECISION MAKING   Pt given ofloxacin for conjunctivitis            Vital Signs: Reviewed the patient's vital signs.   Nursing Notes: Reviewed and utilized available nursing notes.  Medical Records Reviewed: Reviewed available past medical records.    CARDIAC STUDIES    The following cardiac studies were independently interpreted by me the Emergency Medicine Provider.  For full cardiac study results please see chart. I discussed testing results with the APP.              EMERGENCY IMAGING STUDIES    The following imagine studies were independently interpreted by me (emergency medicine provider). I discussed testing results with the APP.                     RADIOLOGY IMAGING STUDIES      No orders to display       EMERGENCY DEPT. MEDICATIONS      ED Medication Orders (From admission, onward)      Start Ordered     Status Ordering Provider    04/01/22 1021 04/01/22 1020  ofloxacin (OCUFLOX) 0.3 % ophthalmic solution 2 drop  Once        Route: Both Eyes  Ordered Dose: 2 drop       Last MAR action: Given MISSANA, MAE L            LABORATORY RESULTS    Ordered and independently interpreted  AVAILABLE laboratory tests.   Results       ** No results found for the last 24 hours. **              CRITICAL CARE/PROCEDURES        DIAGNOSIS    I (ED Physician) discussed final disposition with the APP    Diagnosis:  Final diagnoses:   Acute bacterial conjunctivitis of both eyes       Disposition:  ED Disposition       ED Disposition   Discharge    Condition   --    Date/Time   Wed Apr 01, 2022 10:29 AM  Comment   [place] discharge to home/self care.    Condition at disposition: Stable                 Prescriptions:  Patient's Medications   New Prescriptions    OFLOXACIN (OCUFLOX) 0.3 % OPHTHALMIC SOLUTION    Place 2 drops into both eyes every 2 (two) hours for 2 days, THEN 2 drops 4 (four) times daily for 5 days. 1-2 drops in both eyes every 2 hours while awake for 2 days, then 4 times daily for 5 days for a total of 7 days treatment.   Previous Medications    ALBUTEROL SULFATE HFA (PROVENTIL) 108 (90 BASE) MCG/ACT INHALER    Inhale 2 puffs into the lungs every 4 (four) hours as needed for Wheezing or Shortness of Breath (wheezing) Dispense with spacer    BENZONATATE (TESSALON) 200 MG CAPSULE    Take 1 capsule (200 mg) by mouth 3 (three) times daily as needed for Cough    DEXTROMETHORPHAN-GUAIFENESIN (MUCINEX DM) 30-600 MG PER 12 HR TABLET    Take 1 tablet by mouth every 12 (twelve) hours    MELOXICAM (MOBIC) 7.5 MG TABLET    Take 1 tablet (7.5 mg) by mouth daily as needed for Pain (body aches) Take with food.   Modified Medications    No medications on file   Discontinued Medications    No medications on file         Delle Reining, DO  04/01/22 1030

## 2022-04-04 ENCOUNTER — Emergency Department: Payer: BC Managed Care – PPO

## 2022-04-04 ENCOUNTER — Emergency Department
Admission: EM | Admit: 2022-04-04 | Discharge: 2022-04-04 | Disposition: A | Discharge status: Home / Self Care | Payer: BC Managed Care – PPO | Attending: Emergency Medicine | Admitting: Emergency Medicine

## 2022-04-04 DIAGNOSIS — Z8616 Personal history of COVID-19: Secondary | ICD-10-CM

## 2022-04-04 DIAGNOSIS — Z1152 Encounter for screening for COVID-19: Secondary | ICD-10-CM | POA: Insufficient documentation

## 2022-04-04 DIAGNOSIS — J029 Acute pharyngitis, unspecified: Secondary | ICD-10-CM | POA: Insufficient documentation

## 2022-04-04 LAB — COVID-19 (SARS-COV-2) AND INFLUENZA A/B AND RSV
Influenza A: NEGATIVE
Influenza B: NEGATIVE
Respiratory Syncytial Virus: NEGATIVE
SARS-CoV-2 Overall Result: NEGATIVE

## 2022-04-04 LAB — GROUP A STREP, RAPID ANTIGEN: Group A Strep, Rapid Antigen: NEGATIVE

## 2022-04-04 MED ORDER — OXYMETAZOLINE HCL 0.05 % NA SOLN
2.0000 | Freq: Two times a day (BID) | NASAL | 0 refills | Status: AC
Start: 2022-04-04 — End: ?

## 2022-04-04 MED ORDER — SALINE SPRAY 0.65 % NA SOLN
1.0000 | NASAL | 0 refills | Status: AC | PRN
Start: 2022-04-04 — End: ?

## 2022-04-04 MED ORDER — ACETAMINOPHEN 500 MG PO TABS
1000.0000 mg | ORAL_TABLET | Freq: Once | ORAL | Status: AC
Start: 2022-04-04 — End: 2022-04-04
  Administered 2022-04-04: 1000 mg via ORAL
  Filled 2022-04-04: qty 2

## 2022-04-04 MED ORDER — BENZOCAINE-MENTHOL MT LOZG (WRAP)
1.0000 | LOZENGE | Freq: Once | OROMUCOSAL | Status: AC
Start: 2022-04-04 — End: 2022-04-04
  Administered 2022-04-04: 1 via BUCCAL
  Filled 2022-04-04: qty 1

## 2022-04-04 MED ORDER — IBUPROFEN 600 MG PO TABS
800.0000 mg | ORAL_TABLET | Freq: Once | ORAL | Status: AC
Start: 2022-04-04 — End: 2022-04-04
  Administered 2022-04-04: 800 mg via ORAL
  Filled 2022-04-04: qty 1

## 2022-04-04 MED ORDER — CEPACOL EXTRA STRENGTH 15-2.6 MG MT LOZG
1.0000 | LOZENGE | OROMUCOSAL | 0 refills | Status: AC | PRN
Start: 2022-04-04 — End: ?

## 2022-04-04 NOTE — ED Provider Notes (Signed)
EMERGENCY DEPARTMENT NOTE     Patient initially seen and examined at   ED PHYSICIAN ASSIGNED       Date/Time Event User Comments    04/04/22 0114 Physician Assigned Remer Macho. Remer Macho, DO assigned as Attending           ED [facility] (APP) ASSIGNED       Date/Time Event User Comments    04/04/22 0118 PA/NP Provider Assigned Barbera Setters Barbera Setters, PA assigned as Physician Assistant            HISTORY OF PRESENT ILLNESS       Chief Complaint: Sore Throat       32 y.o. female with past medical history as below resents ambulatory into ED reporting 2 weeks of sore throat with "pain at 100" reports some relief from ibuprofen. Patient tested positive for COVID on 1/07. Reports pain started along with other URI symptoms. Patient still reports a cough. She denies abdominal pain, nausea, vomiting or diarrhea.    Independent Historian (other than patient): No  Additional History Provided by Independent Historian:  MEDICAL HISTORY     Past Medical History:  History reviewed. No pertinent past medical history.    Past Surgical History:  History reviewed. No pertinent surgical history.    Social History:  Social History     Socioeconomic History    Marital status: Single   Tobacco Use    Smoking status: Never    Smokeless tobacco: Never   Vaping Use    Vaping Use: Never used   Substance and Sexual Activity    Alcohol use: Yes    Drug use: Never     Social Determinants of Health     Food Insecurity: No Food Insecurity (04/04/2022)    Hunger Vital Sign     Worried About Running Out of Food in the Last Year: Never true     Ran Out of Food in the Last Year: Never true   Transportation Needs: No Transportation Needs (03/22/2022)    PRAPARE - Armed forces logistics/support/administrative officer (Medical): No     Lack of Transportation (Non-Medical): No   Intimate Partner Violence: Not At Risk (04/04/2022)    Humiliation, Afraid, Rape, and Kick questionnaire     Fear of Current or Ex-Partner: No     Emotionally  Abused: No     Physically Abused: No     Sexually Abused: No   Housing Stability: Unknown (03/22/2022)    Housing Stability Vital Sign     Unstable Housing in the Last Year: No       Family History:  History reviewed. No pertinent family history.    Outpatient Medication:  Previous Medications    ALBUTEROL SULFATE HFA (PROVENTIL) 108 (90 BASE) MCG/ACT INHALER    Inhale 2 puffs into the lungs every 4 (four) hours as needed for Wheezing or Shortness of Breath (wheezing) Dispense with spacer    BENZONATATE (TESSALON) 200 MG CAPSULE    Take 1 capsule (200 mg) by mouth 3 (three) times daily as needed for Cough    DEXTROMETHORPHAN-GUAIFENESIN (MUCINEX DM) 30-600 MG PER 12 HR TABLET    Take 1 tablet by mouth every 12 (twelve) hours    MELOXICAM (MOBIC) 7.5 MG TABLET    Take 1 tablet (7.5 mg) by mouth daily as needed for Pain (body aches) Take with food.    OFLOXACIN (OCUFLOX) 0.3 % OPHTHALMIC SOLUTION    Place 2 drops  into both eyes every 2 (two) hours for 2 days, THEN 2 drops 4 (four) times daily for 5 days. 1-2 drops in both eyes every 2 hours while awake for 2 days, then 4 times daily for 5 days for a total of 7 days treatment.         REVIEW OF SYSTEMS   Review of Systems See History of Present Illness  PHYSICAL EXAM     ED Triage Vitals [04/04/22 0117]   Enc Vitals Group      BP 124/62      Heart Rate (!) 108      Resp Rate 18      Temp 98.5 F (36.9 C)      Temp Source Temporal      SpO2 97 %      Weight 129.3 kg      Height       Head Circumference       Peak Flow       Pain Score 10      Pain Loc       Pain Edu?       Excl. in [place]?      Physical Exam  Vitals and nursing note reviewed.   Constitutional:       Appearance: Normal appearance. She is obese.   HENT:      Head: Normocephalic and atraumatic.      Jaw: There is normal jaw occlusion. No swelling, pain on movement or malocclusion.      Salivary Glands: Right salivary gland is not diffusely enlarged or tender. Left salivary gland is not diffusely enlarged or  tender.      Right Ear: Tympanic membrane and ear canal normal. No swelling. No middle ear effusion. Tympanic membrane is not erythematous.      Left Ear: Tympanic membrane and ear canal normal. No swelling.  No middle ear effusion. Tympanic membrane is not erythematous.      Nose: Congestion present.      Right Sinus: No maxillary sinus tenderness or frontal sinus tenderness.      Left Sinus: No maxillary sinus tenderness or frontal sinus tenderness.      Mouth/Throat:      Lips: Pink.      Mouth: Mucous membranes are moist. No oral lesions.      Tongue: No lesions. Tongue does not deviate from midline.      Palate: No mass and lesions.      Pharynx: Oropharynx is clear. No pharyngeal swelling, oropharyngeal exudate, posterior oropharyngeal erythema or uvula swelling.      Tonsils: No tonsillar exudate or tonsillar abscesses.   Eyes:      General: Lids are normal.      Conjunctiva/sclera: Conjunctivae normal.   Neck:      Thyroid: No thyroid mass or thyroid tenderness.      Trachea: Trachea and phonation normal.   Cardiovascular:      Rate and Rhythm: Normal rate and regular rhythm.      Heart sounds: Normal heart sounds, S1 normal and S2 normal.   Pulmonary:      Breath sounds: Normal breath sounds and air entry. No decreased breath sounds, wheezing or rhonchi.   Musculoskeletal:      Cervical back: No edema. No pain with movement.   Lymphadenopathy:      Head:      Right side of head: No submental, submandibular, tonsillar, preauricular, posterior auricular or occipital adenopathy.      Left  side of head: No submental, submandibular, tonsillar, preauricular, posterior auricular or occipital adenopathy.      Cervical: No cervical adenopathy.      Right cervical: No superficial, deep or posterior cervical adenopathy.     Left cervical: No superficial, deep or posterior cervical adenopathy.   Skin:     General: Skin is warm.      Capillary Refill: Capillary refill takes less than 2 seconds.   Neurological:       Mental Status: She is alert.      GCS: GCS eye subscore is 4. GCS verbal subscore is 5. GCS motor subscore is 6.      Cranial Nerves: Cranial nerves 2-12 are intact.   Psychiatric:         Attention and Perception: Attention and perception normal.         Mood and Affect: Mood and affect normal.         Speech: Speech normal.         Behavior: Behavior normal. Behavior is cooperative.         Thought Content: Thought content normal.         MEDICAL DECISION MAKING     PRIMARY PROBLEM LIST      Acute illness/injury DIAGNOSIS: Sore Throat   Chronic Illness Impacting Care of the above problem: Obesity Increases complexity of evaluation  Differential Diagnosis: URI (adult): pleuritis, URI, pneumonia, viral syndrome, asthma/COPD exacerbation, respiratory failure, sepsis, pulmonary edema, myocarditis, COVID19     DISCUSSION      Patient presents with chief complaint of sore throat and lingering cough following diagnosis with COVID on 1/07.  Patient reports difficulty swallowing and sleeping due to pain.  Vitals are unremarkable. No fever.  Physical exam unremarkable.  No swelling of the neck noted and no lymphadenopathy noted.  Normal ROM.  No tonsillar exudate or peritonsillar or pharyngeal swelling noted.  TM and ear canal normal bilaterally.  No abnormal lung sounds.  Remainder of physical exam unremarkable.  Negative for COVID, Influenza, RSV and Strep  Prescribed analgesia for pain.  Discussed patient with Dr. Andree Moro who agreed with assessment and treatment plan.  Ordered neck soft tissue  and chest XR.  Singed patient out to Dr. Andree Moro waiting XR results.          If patient is being hospitalized is severe sepsis or septic shock suspected?: N/A          External Records Reviewed?: Inpatient Records    Additional Notes                     Vital Signs: Reviewed the patient's vital signs.   Nursing Notes: Reviewed and utilized available nursing notes.  Medical Records Reviewed: Reviewed available past medical  records.  Counseling: The emergency provider has spoken with the patient and discussed today's findings, in addition to providing specific details for the plan of care.  Questions are answered and there is agreement with the plan.      MIPS DOCUMENTATION              CARDIAC STUDIES    The following cardiac studies were independently interpreted by me the Emergency Medicine Provider.  For full cardiac study results please see chart.  EMERGENCY IMAGING STUDIES    The following imagine studies were independently interpreted by me (emergency medicine provider):                       RADIOLOGY IMAGING STUDIES      Neck Soft Tissue    (Results Pending)   Chest 2 Views    (Results Pending)       EMERGENCY DEPT. MEDICATIONS      ED Medication Orders (From admission, onward)      Start Ordered     Status Ordering Provider    04/04/22 0245 04/04/22 0230  benzocaine-menthol (CEPACOL/CHLORASEPTIC) lozenge 1 lozenge  Once        Route: Buccal  Ordered Dose: 1 lozenge       Acknowledged Yvonnie Schinke C    04/04/22 0208 04/04/22 0207  ibuprofen (ADVIL) tablet 800 mg  Once        Route: Oral  Ordered Dose: 800 mg       Last MAR action: Given Colton Engdahl C    04/04/22 0208 04/04/22 0207  acetaminophen (TYLENOL) tablet 1,000 mg  Once        Route: Oral  Ordered Dose: 1,000 mg       Last MAR action: Given Gerturde Kuba C            LABORATORY RESULTS    Ordered and independently interpreted AVAILABLE laboratory tests.   Results       Procedure Component Value Units Date/Time    COVID-19 (SARS-CoV-2) and Influenza A/B & RSV (Cepheid)- Age less than 5 years [id number] Collected: 04/04/22 0122    Specimen: Nasopharyngeal Swab Updated: 04/04/22 0226     Purpose of COVID testing Diagnostic -[facility]I     SARS-CoV-2 Specimen Source Nasal Swab     SARS CoV 2 Overall Result Negative     Influenza A Negative     Influenza B Negative     Respiratory Syncytial Virus Negative     Narrative:      o Collect and clearly label specimen type:  o PREFERRED-Upper respiratory specimen: One Nasal Swab in  Transport Media.  o Hand deliver to laboratory ASAP  Diagnostic -[facility]    GROUP A STREP, RAPID ANTIGEN [id number] Collected: 04/04/22 0122    Specimen: Throat Updated: 04/04/22 0156     Group A Strep, Rapid Antigen Negative              CRITICAL CARE/PROCEDURES    Procedures  DIAGNOSIS      Diagnosis:  Final diagnoses:   None       Disposition:  ED Disposition       None            Prescriptions:  Patient's Medications   New Prescriptions    BENZOCAINE-MENTHOL (CEPACOL EXTRA STRENGTH) 15-2.6 MG LOZENGE    Place 1 lozenge inside cheek as needed (For sore thorat)    OXYMETAZOLINE (AFRIN) 0.05 % NASAL SPRAY    2 sprays by Nasal route 2 (two) times daily    SALINE (OCEAN NASAL SPRAY) 0.65 % SOLUTION    1 spray by Each Nare route as needed (For congestion)   Previous Medications    ALBUTEROL SULFATE HFA (PROVENTIL) 108 (90 BASE) MCG/ACT INHALER    Inhale 2 puffs into the lungs every 4 (four) hours as needed for Wheezing or Shortness of Breath (wheezing) Dispense with spacer    BENZONATATE (TESSALON) 200 MG CAPSULE    Take 1 capsule (  200 mg) by mouth 3 (three) times daily as needed for Cough    DEXTROMETHORPHAN-GUAIFENESIN (MUCINEX DM) 30-600 MG PER 12 HR TABLET    Take 1 tablet by mouth every 12 (twelve) hours    MELOXICAM (MOBIC) 7.5 MG TABLET    Take 1 tablet (7.5 mg) by mouth daily as needed for Pain (body aches) Take with food.    OFLOXACIN (OCUFLOX) 0.3 % OPHTHALMIC SOLUTION    Place 2 drops into both eyes every 2 (two) hours for 2 days, THEN 2 drops 4 (four) times daily for 5 days. 1-2 drops in both eyes every 2 hours while awake for 2 days, then 4 times daily for 5 days for a total of 7 days treatment.   Modified Medications    No medications on file   Discontinued Medications    No medications on file           This note was generated by the Epic EMR system/ Dragon speech recognition and may  contain inherent errors or omissions not intended by the user. Grammatical errors, random word insertions, deletions and pronoun errors  are occasional consequences of this technology due to software limitations. Not all errors are caught or corrected. If there are questions or concerns about the content of this note or information contained within the body of this dictation they should be addressed directly with the author for clarification.           Barbera Setters, PA  04/04/22 0256       Barbera Setters, PA  04/04/22 0300

## 2022-04-04 NOTE — ED Provider Notes (Signed)
EMERGENCY DEPARTMENT ATTENDING PHYSICIAN NOTE     I performed the substantive portion of the visit. Medical decision-making: For the problems addressed, I personally developed, reviewed, and/or approved the plan and assessment as documented by the APP. Patient was seen by APP, see their note for documentation of history and exam.    BRIEF HISTORY OF PRESENT ILLNESS AND ADDITIONAL EXAM FINDINGS     Chief Complaint: Sore Throat       History per APP        Triage Vitals:  ED Triage Vitals [04/04/22 0117]   Enc Vitals Group      BP 124/62      Heart Rate (!) 108      Resp Rate 18      Temp 98.5 F (36.9 C)      Temp Source Temporal      SpO2 97 %      Weight 129.3 kg      Height       Head Circumference       Peak Flow       Pain Score 10      Pain Loc       Pain Edu?       Excl. in [place]?             MEDICAL DECISION MAKING           Vital Signs: Reviewed the patient's vital signs.   Nursing Notes: Reviewed and utilized available nursing notes.  Medical Records Reviewed: Reviewed available past medical records.    CARDIAC STUDIES    The following cardiac studies were independently interpreted by me the Emergency Medicine Provider.  For full cardiac study results please see chart. I discussed testing results with the APP.              EMERGENCY IMAGING STUDIES    The following imagine studies were independently interpreted by me (emergency medicine provider). I discussed testing results with the APP.    Chest Xray Interpreted by me (ED provider) SIDE : N/A  RESULT: No infiltrate. No pneumothorax. No large hemothorax. No CHF.  IMPRESSION: No acute abnormality            RADIOLOGY IMAGING STUDIES      Neck Soft Tissue   Final Result      1.No acute finding to exclude and patient presentation.      Carolee Rota, MD   04/04/2022 3:35 AM      Chest 2 Views   Final Result      No acute cardiopulmonary abnormality.      Park Pope, MD   04/04/2022 2:59 AM          EMERGENCY DEPT. MEDICATIONS      ED Medication Orders (From  admission, onward)      Start Ordered     Status Ordering Provider    04/04/22 0245 04/04/22 0230  benzocaine-menthol (CEPACOL/CHLORASEPTIC) lozenge 1 lozenge  Once        Route: Buccal  Ordered Dose: 1 lozenge       Last MAR action: Given MUNTEANU, OCTAVIAN C    04/04/22 0208 04/04/22 0207  ibuprofen (ADVIL) tablet 800 mg  Once        Route: Oral  Ordered Dose: 800 mg       Last MAR action: Given MUNTEANU, OCTAVIAN C    04/04/22 0208 04/04/22 0207  acetaminophen (TYLENOL) tablet 1,000 mg  Once  Route: Oral  Ordered Dose: 1,000 mg       Last MAR action: Given MUNTEANU, OCTAVIAN C            LABORATORY RESULTS    Ordered and independently interpreted AVAILABLE laboratory tests.   Results       Procedure Component Value Units Date/Time    COVID-19 (SARS-CoV-2) and Influenza A/B & RSV (Cepheid)- Age less than 5 years [id number] Collected: 04/04/22 0122    Specimen: Nasopharyngeal Swab Updated: 04/04/22 0226     Purpose of COVID testing Diagnostic -[facility]     SARS-CoV-2 Specimen Source Nasal Swab     SARS CoV 2 Overall Result Negative     Influenza A Negative     Influenza B Negative     Respiratory Syncytial Virus Negative    Narrative:      o Collect and clearly label specimen type:  o PREFERRED-Upper respiratory specimen: One Nasal Swab in  Transport Media.  o Hand deliver to laboratory ASAP  Diagnostic -[facility]    GROUP A STREP, RAPID ANTIGEN [id number] Collected: 04/04/22 0122    Specimen: Throat Updated: 04/04/22 0156     Group A Strep, Rapid Antigen Negative              CRITICAL CARE/PROCEDURES        DIAGNOSIS    I (ED Physician) discussed final disposition with the APP    Diagnosis:  Final diagnoses:   Sore throat   History of COVID-19       Disposition:  ED Disposition       ED Disposition   Discharge    Condition   --    Date/Time   Sat Apr 04, 2022  3:54 AM    Comment   [place] discharge to home/self care.    Condition at disposition: Stable                 Prescriptions:  Patient's  Medications   New Prescriptions    BENZOCAINE-MENTHOL (CEPACOL EXTRA STRENGTH) 15-2.6 MG LOZENGE    Place 1 lozenge inside cheek as needed (For sore thorat)    OXYMETAZOLINE (AFRIN) 0.05 % NASAL SPRAY    2 sprays by Nasal route 2 (two) times daily    SALINE (OCEAN NASAL SPRAY) 0.65 % SOLUTION    1 spray by Each Nare route as needed (For congestion)   Previous Medications    ALBUTEROL SULFATE HFA (PROVENTIL) 108 (90 BASE) MCG/ACT INHALER    Inhale 2 puffs into the lungs every 4 (four) hours as needed for Wheezing or Shortness of Breath (wheezing) Dispense with spacer    BENZONATATE (TESSALON) 200 MG CAPSULE    Take 1 capsule (200 mg) by mouth 3 (three) times daily as needed for Cough    DEXTROMETHORPHAN-GUAIFENESIN (MUCINEX DM) 30-600 MG PER 12 HR TABLET    Take 1 tablet by mouth every 12 (twelve) hours    MELOXICAM (MOBIC) 7.5 MG TABLET    Take 1 tablet (7.5 mg) by mouth daily as needed for Pain (body aches) Take with food.    OFLOXACIN (OCUFLOX) 0.3 % OPHTHALMIC SOLUTION    Place 2 drops into both eyes every 2 (two) hours for 2 days, THEN 2 drops 4 (four) times daily for 5 days. 1-2 drops in both eyes every 2 hours while awake for 2 days, then 4 times daily for 5 days for a total of 7 days treatment.   Modified Medications    No medications on  file   Discontinued Medications    No medications on file         Remer Macho, DO  04/04/22 [id number]

## 2022-04-20 ENCOUNTER — Emergency Department
Admission: EM | Admit: 2022-04-20 | Discharge: 2022-04-20 | Disposition: A | Discharge status: Home / Self Care | Payer: Medicaid HMO | Attending: Emergency Medicine | Admitting: Emergency Medicine

## 2022-04-20 DIAGNOSIS — X500XXA Overexertion from strenuous movement or load, initial encounter: Secondary | ICD-10-CM | POA: Insufficient documentation

## 2022-04-20 DIAGNOSIS — S39012A Strain of muscle, fascia and tendon of lower back, initial encounter: Secondary | ICD-10-CM

## 2022-04-20 MED ORDER — CYCLOBENZAPRINE HCL 10 MG PO TABS
10.0000 mg | ORAL_TABLET | Freq: Three times a day (TID) | ORAL | 0 refills | Status: AC
Start: 2022-04-20 — End: 2022-04-30

## 2022-04-20 NOTE — Discharge Instructions (Signed)
Dear Ms. Grandville Silos:    Thank you for choosing one of [facility] emergency departments.  I hope your visit today was EXCELLENT.    Specific instructions for your visit today:    No heavy lifting, pushing, pulling of anything greater than 10 pounds over the next 1 week.  Continue applying warm moist compresses to the sore muscles of your lower back 2-3 times a day, 10 to 15 minutes each time.  Take the Flexeril as prescribed 1 tablet every 8 hours around-the-clock over the next 1 week.  Also take ibuprofen (Advil Motrin IB) 200 mg tablets-4 tablets every 8 hours around-the-clock over the next [redacted] week along with the Flexeril.  Contact your primary care physician for reevaluation within the next 1 week.    IF YOU DO NOT CONTINUE TO IMPROVE OR YOUR CONDITION WORSENS, PLEASE CONTACT YOUR DOCTOR OR RETURN IMMEDIATELY TO THE EMERGENCY DEPARTMENT.    Sincerely,  Melton Alar, MD  Attending Emergency Physician  [facility] Emergency Department    OBTAINING A PRIMARY CARE APPOINTMENT    Primary care physicians (PCPs, also known as primary care doctors) are either internists or family medicine doctors. Both types of PCPs focus on health promotion, disease prevention, patient education and counseling, and treatment of acute and chronic medical conditions.    Call for an appointment with a primary care doctor.  Ask to see who is taking new patients.     [place]  telephone:  [phone]  [email or web address]    For a pediatrician, call the [facility] referral line below.  You can also call to make an appointment at [facility] (except [facility] and [facility]):    [place],  [address]  [place]  Call [phone] (available 24 hours a day, 7 days a week) if you need any further referrals and we can help you find a primary care doctor or specialist.  Also, available online at:   [email or web address]    For more information regarding our services at [facility], please call the number above or visit the website [email or web address]    YOUR CONTACT INFORMATION  Before leaving please check with registration to make sure we have an up-to-date contact number.  You can call registration at [phone], Option 7 [facility] location) or [phone], Option 1 [facility] location) to update your information.  For questions about your hospital bill, please call [phone].  For questions about your Emergency Dept Physician bill please call [phone].      [place]  If you need help with health or social services, please call 2-1-1 for a free referral to resources in your area.  2-1-1 is a free service connecting people with information on health insurance, free clinics, pregnancy, mental health, dental care, food assistance, housing, and substance abuse counseling.  Also, available online at:  http://www.211[place].org    ORTHOPEDIC INJURY   Please know that significant injuries can exist even when an initial x-ray is read as normal or negative.  This can occur because some fractures (broken bones) are not initially visible on x-rays.  For this reason, close outpatient follow-up with your primary care doctor or bone specialist (orthopedist) is required.    MEDICATIONS AND FOLLOWUP  Please be aware that some prescription medications can cause drowsiness.  Use caution when driving or operating machinery.    The examination and treatment you have received in our Emergency  Department is provided on an emergency basis, and is not intended to be a substitute for your primary care physician.  It is important that your doctor checks you again and that you report any new or remaining problems at that time.        ASSISTANCE WITH INSURANCE    Affordable Care Act  [facility])  Call to start or finish an application, compare plans, enroll or ask a  question.  [place]: [phone]  Web:  Healthcare.gov    Help Enrolling in [place]  902-043-0696 (TOLL-FREE)  (778)674-9317 (TTY)  Web:  Http://www.coverva.org    Local Help Enrolling in the [place]  (872)883-7394 (MAIN)  Email:  health-help'@nvfs'$ .org  Web:  http://lewis-perez.info/  Address:  [id number] [place], [place] [address]    SEDATING MEDICATIONS  Sedating medications include strong pain medications (e.g. narcotics), muscle relaxers, benzodiazepines (used for anxiety and as muscle relaxers), Benadryl/diphenhydramine and other antihistamines for allergic reactions/itching, and other medications.  If you are unsure if you have received a sedating medication, please ask your physician or nurse.  If you received a sedating medication: DO NOT drive a car. DO NOT operate machinery. DO NOT perform jobs where you need to be alert.  DO NOT drink alcoholic beverages while taking this medicine.     If you get dizzy, sit or lie down at the first signs. Be careful going up and down stairs.  Be extra careful to prevent falls.     Never give this medicine to others.     Keep this medicine out of reach of children.     Do not take or save old medicines. Throw them away when outdated.     Keep all medicines in a cool, dry place. DO NOT keep them in your bathroom medicine cabinet or in a cabinet above the stove.    MEDICATION REFILLS  Please be aware that we cannot refill any prescriptions through the ER. If you need further treatment from what is provided at your ER visit, please follow up with your primary care doctor or your pain management specialist.

## 2022-04-20 NOTE — ED Provider Notes (Signed)
[place]  ATTENDING PHYSICIAN HISTORY AND PHYSICAL EXAM     Patient Name: Susan Olsen, Susan Olsen  Department:[facility] [place]  Encounter Date:  04/20/2022  Attending Physician: Melton Alar, MD   Age: 32 y.o. female  Patient Room: 3/M 3  PCP: Junius Roads, MD           Diagnosis/Disposition:     Final diagnoses:   Strain of lumbar paraspinal muscle, initial encounter       ED Disposition       ED Disposition   Discharge    Condition   --    Date/Time   Mon Apr 20, 2022  1:56 PM    Comment   Keerthana Cleaves discharge to home/self care.    Condition at disposition: Stable                 Follow-Up Providers (if applicable)    Your primary care physician    Schedule an appointment as soon as possible for a visit in 1 week  For reevaluation       New Prescriptions    CYCLOBENZAPRINE (FLEXERIL) 10 MG TABLET    Take 1 tablet (10 mg) by mouth 3 (three) times daily with meals for 10 days           Medical Decision Making:     Initial Differential Diagnosis:  Initial differential diagnosis to include but not limited to: muscular strain, lumbar strain, herniated disc, cauda equina, pyelonephritis    Plan:  Clinical evaluation    Final Impression:  Lumbosacral muscle strain  The patient was deemed stable for discharge. They were given strict return precautions as it relates to their presumed diagnosis, verbalized understanding of these precautions and agreed to follow up as instructed. All questions were answered prior to discharge.         Medical Decision Making  Risk  Prescription drug management.      Records Reviewed (internal and external)?: N/A  Was management discussed with a consultant?: N/A  Diagnostic test considered and not performed: N/A  Prescription medications considered and not given: N/A  Hospitalization considered but not done : The patient did not meet criteria for admission.  Was the decision around the need for surgery discussed with a consultant?: N/A  Social Determinants  of Health Considerations: N/A  Was there decision to not resuscitate or to de-escalate care due to poor prognosis?: N/A        History of Presenting Illness:     Nursing Triage note: pt states left sided lower back pain with a history of the same. pt states heavy lifting saturday. denies GI/GU complaints.  Chief complaint: Back Pain    Susan Olsen is a 32 y.o. female presents to the emergency department for evaluation of pain in the left lower back that she states began on Saturday which was 3 days ago.  She says she had been doing some heavy lifting and by that evening she started feeling some sharp pain in the lower back.  She says the pain is  persistent particularly whenever she moves around.  She denies any numbness or tingling.  She denies any motor dysfunction.  She denies any fever chills nausea or vomiting.  She denies any urinary complaints.          Review of Systems:  Physical Exam:     Review of Systems    Positive and negative ROS per above and in HPI. All other systems reviewed and  negative.     Pulse 89  BP 141/74  Resp 18  SpO2 98 %  Temp 97 F (36.1 C)     Physical Exam  Vitals and nursing note reviewed.   Constitutional:       General: She is not in acute distress.     Appearance: She is well-developed.   HENT:      Head: Normocephalic and atraumatic.   Eyes:      General: No scleral icterus.  Pulmonary:      Effort: Pulmonary effort is normal. No respiratory distress.      Breath sounds: Normal breath sounds.   Musculoskeletal:         General: Tenderness present. No swelling, deformity or signs of injury. Normal range of motion.      Cervical back: Normal range of motion and neck supple. No tenderness.      Right lower leg: No edema.      Left lower leg: No edema.      Comments: Patient has some mild paraspinal lumbar tenderness to palpation.  No point tenderness noted to the spinous processes of the cervical area thoracic or lumbar sacral area.  No pain is elicited in the back  on straight leg raising   Lymphadenopathy:      Cervical: No cervical adenopathy.   Skin:     General: Skin is warm and dry.      Capillary Refill: Capillary refill takes less than 2 seconds.      Coloration: Skin is not pale.      Findings: No rash.   Neurological:      Mental Status: She is alert and oriented to person, place, and time.      Sensory: No sensory deficit.      Motor: No weakness.      Coordination: Coordination normal.      Gait: Gait normal.      Deep Tendon Reflexes: Reflexes normal.   Psychiatric:         Behavior: Behavior normal.         Judgment: Judgment normal.                 Interpretations, Clinical Decision Tools and Critical Care:     O2 Sat:  The patient's oxygen saturation was 98 % on room air. This was independently interpreted by me as Normal.                Procedures:   Procedures      Attestations:     Scribe Attestation: There was no scribe involved in the care of this patient.     Documentation Notes:  Parts of this note were generated by the Epic EMR system/ Dragon speech recognition and may contain inherent errors or omissions not intended by the user. Grammatical errors, random word insertions, deletions, pronoun errors and incomplete sentences are occasional consequences of this technology due to software limitations. Not all errors are caught or corrected.  My documentation is often completed after the patient is no longer under my clinical care. In some cases, the Epic EMR may pull updated results into the above documentation which may not reflect all results or information that were available to me at the time of my medical decision making.   If there are questions or concerns about the content of this note or information contained within the body of this dictation they should be addressed directly with the author for clarification.  Melton Alar, MD  04/24/22 [phone]

## 2022-04-27 ENCOUNTER — Emergency Department: Payer: Medicaid HMO

## 2022-04-27 ENCOUNTER — Other Ambulatory Visit: Payer: Self-pay

## 2022-04-27 ENCOUNTER — Emergency Department
Admission: EM | Admit: 2022-04-27 | Discharge: 2022-04-27 | Disposition: A | Discharge status: Home / Self Care | Payer: Medicaid HMO | Attending: Emergency Medicine | Admitting: Emergency Medicine

## 2022-04-27 ENCOUNTER — Emergency Department
Admission: EM | Admit: 2022-04-27 | Discharge: 2022-04-27 | Payer: Medicaid HMO | Attending: Emergency Medicine | Admitting: Emergency Medicine

## 2022-04-27 DIAGNOSIS — R0602 Shortness of breath: Secondary | ICD-10-CM | POA: Insufficient documentation

## 2022-04-27 DIAGNOSIS — F1090 Alcohol use, unspecified, uncomplicated: Secondary | ICD-10-CM | POA: Insufficient documentation

## 2022-04-27 DIAGNOSIS — E119 Type 2 diabetes mellitus without complications: Secondary | ICD-10-CM | POA: Insufficient documentation

## 2022-04-27 DIAGNOSIS — R071 Chest pain on breathing: Secondary | ICD-10-CM | POA: Insufficient documentation

## 2022-04-27 DIAGNOSIS — J209 Acute bronchitis, unspecified: Secondary | ICD-10-CM | POA: Insufficient documentation

## 2022-04-27 DIAGNOSIS — I10 Essential (primary) hypertension: Secondary | ICD-10-CM | POA: Insufficient documentation

## 2022-04-27 DIAGNOSIS — R7989 Other specified abnormal findings of blood chemistry: Secondary | ICD-10-CM | POA: Insufficient documentation

## 2022-04-27 DIAGNOSIS — R079 Chest pain, unspecified: Secondary | ICD-10-CM | POA: Insufficient documentation

## 2022-04-27 DIAGNOSIS — L0291 Cutaneous abscess, unspecified: Secondary | ICD-10-CM | POA: Insufficient documentation

## 2022-04-27 DIAGNOSIS — R072 Precordial pain: Secondary | ICD-10-CM | POA: Insufficient documentation

## 2022-04-27 DIAGNOSIS — Z7984 Long term (current) use of oral hypoglycemic drugs: Secondary | ICD-10-CM | POA: Insufficient documentation

## 2022-04-27 LAB — URINE HCG QUALITATIVE: Urine HCG Qualitative: NEGATIVE

## 2022-04-27 LAB — CBC AND DIFFERENTIAL
Absolute NRBC: 0 10*3/uL (ref 0.00–0.00)
Basophils Absolute Automated: 0.05 10*3/uL (ref 0.00–0.08)
Basophils Automated: 0.6 %
Eosinophils Absolute Automated: 0.6 10*3/uL — ABNORMAL HIGH (ref 0.00–0.44)
Eosinophils Automated: 7.6 %
Hematocrit: 36.3 % (ref 34.7–43.7)
Hgb: 11.6 g/dL (ref 11.4–14.8)
Immature Granulocytes Absolute: 0.02 10*3/uL (ref 0.00–0.07)
Immature Granulocytes: 0.3 %
Instrument Absolute Neutrophil Count: 4.66 10*3/uL (ref 1.10–6.33)
Lymphocytes Absolute Automated: 2.04 10*3/uL (ref 0.42–3.22)
Lymphocytes Automated: 25.7 %
MCH: 26.9 pg (ref 25.1–33.5)
MCHC: 32 g/dL (ref 31.5–35.8)
MCV: 84 fL (ref 78.0–96.0)
MPV: 10.9 fL (ref 8.9–12.5)
Monocytes Absolute Automated: 0.57 10*3/uL (ref 0.21–0.85)
Monocytes: 7.2 %
Neutrophils Absolute: 4.66 10*3/uL (ref 1.10–6.33)
Neutrophils: 58.6 %
Nucleated RBC: 0 /100 WBC (ref 0.0–0.0)
Platelets: 368 10*3/uL — ABNORMAL HIGH (ref 142–346)
RBC: 4.32 10*6/uL (ref 3.90–5.10)
RDW: 14 % (ref 11–15)
WBC: 7.94 10*3/uL (ref 3.10–9.50)

## 2022-04-27 LAB — URINALYSIS WITH REFLEX TO MICROSCOPIC EXAM - REFLEX TO CULTURE
Bilirubin, UA: NEGATIVE
Glucose, UA: 100 — AB
Nitrite, UA: NEGATIVE
Protein, UR: 30 — AB
Specific Gravity UA: 1.025 (ref 1.001–1.035)
Urine pH: 7 (ref 5.0–8.0)
Urobilinogen, UA: 0.2 mg/dL

## 2022-04-27 LAB — COVID-19 (SARS-COV-2) & INFLUENZA  A/B, NAA (ROCHE LIAT)
Influenza A: NOT DETECTED
Influenza B: NOT DETECTED
SARS-CoV-2 Overall Result: NOT DETECTED

## 2022-04-27 LAB — COMPREHENSIVE METABOLIC PANEL
ALT: 12 U/L (ref 0–55)
AST (SGOT): 8 U/L (ref 5–41)
Albumin/Globulin Ratio: 0.8 — ABNORMAL LOW (ref 0.9–2.2)
Albumin: 3.2 g/dL — ABNORMAL LOW (ref 3.5–5.0)
Alkaline Phosphatase: 103 U/L (ref 37–117)
Anion Gap: 7 (ref 5.0–15.0)
BUN: 8 mg/dL (ref 7.0–21.0)
Bilirubin, Total: 0.1 mg/dL — ABNORMAL LOW (ref 0.2–1.2)
CO2: 28 mEq/L (ref 17–29)
Calcium: 9.4 mg/dL (ref 8.5–10.5)
Chloride: 105 mEq/L (ref 99–111)
Creatinine: 0.7 mg/dL (ref 0.4–1.0)
Globulin: 4.1 g/dL — ABNORMAL HIGH (ref 2.0–3.6)
Glucose: 195 mg/dL — ABNORMAL HIGH (ref 70–100)
Potassium: 4.1 mEq/L (ref 3.5–5.3)
Protein, Total: 7.3 g/dL (ref 6.0–8.3)
Sodium: 140 mEq/L (ref 135–145)
eGFR: >60 mL/min/{1.73_m2} (ref >=60)

## 2022-04-27 LAB — HIGH SENSITIVITY TROPONIN-I: hs Troponin-I: <2.7 ng/L

## 2022-04-27 LAB — D-DIMER: D-Dimer: 0.97 ug/mL FEU — ABNORMAL HIGH (ref 0.00–0.60)

## 2022-04-27 MED ORDER — IOHEXOL 350 MG/ML IV SOLN
100.0000 mL | Freq: Once | INTRAVENOUS | Status: AC | PRN
Start: 2022-04-27 — End: 2022-04-27
  Administered 2022-04-27: 100 mL via INTRAVENOUS

## 2022-04-27 MED ORDER — ALBUTEROL SULFATE HFA 108 (90 BASE) MCG/ACT IN AERS
2.0000 | INHALATION_SPRAY | RESPIRATORY_TRACT | 0 refills | Status: AC | PRN
Start: 2022-04-27 — End: 2022-05-27

## 2022-04-27 MED ORDER — ALBUTEROL-IPRATROPIUM 2.5-0.5 (3) MG/3ML IN SOLN
3.0000 mL | Freq: Once | RESPIRATORY_TRACT | Status: AC
Start: 2022-04-27 — End: 2022-04-27
  Administered 2022-04-27: 3 mL via RESPIRATORY_TRACT
  Filled 2022-04-27: qty 3

## 2022-04-27 MED ORDER — KETOROLAC TROMETHAMINE 30 MG/ML IJ SOLN
30.0000 mg | Freq: Once | INTRAMUSCULAR | Status: AC
Start: 2022-04-27 — End: 2022-04-27
  Administered 2022-04-27: 30 mg via INTRAVENOUS
  Filled 2022-04-27: qty 1

## 2022-04-27 NOTE — EDIE (Signed)
[facility]?NOTIFICATION?04/27/2022 20:22?ARNICE, HODUM R?MRN: [id number]    Criteria Met      5 ED Visits in 12 Months    Security and Safety  No Security Events were found.  ED Care Guidelines  There are currently no ED Care Guidelines for this patient. Please check your facility's medical records system.        Prescription Monitoring Program  000 ??- Narcotic Use Score   000 ??- Sedative Use Score   000 ??- Stimulant Use Score   000??- Overdose Risk Score  - All Scores range from 000-999 with 75% of the population scoring < 200 and only 1% scoring above 650  - The last digit of the narcotic, sedative, and stimulant score indicates the number of active prescriptions of that type  - Higher Use scores correlate with increased prescribers, pharmacies, mg equiv, and overlapping prescriptions   - Higher Overdose Risk Scores correlate with increased risk of unintentional overdose death   Concerning or unexpectedly high scores should prompt a review of the PMP record; this does not constitute checking PMP for prescribing purposes.    E.D. Visit Count (12 mo.)  Facility Visits   [place] [facility]: [place] [facility] 2   Total 6   Note: Visits indicate total known visits.     Recent Emergency Department Visit Summary  Date Facility [facility] Type Diagnoses or Chief Complaint    Apr 27, 2022  [place]  [place]  Emergency      [facility] [place] - r/o PE      Apr 27, 2022  [place] [facility]: [facility]  [place]  Emergency      Chest pain, unspecified      Other specified abnormal findings of blood chemistry      Shortness of Breath      Chest Pain      chest pain sob,cyst under arm near sycope      Apr 20, 2022  [place] [facility]: [facility].  [place]  Emergency      Strain of muscle, fascia and tendon of lower back, initial encounter      Back Pain      Back Pain;      Feb 14, 2022  [place].  [place].  [place]  Emergency      Calcific tendinitis, unspecified site      Pain in  right shoulder      Arm Pain      right arm pain      Sep 16, 2021  [place] [facility]: [facility].  [place]  Emergency      Solitary cyst of left breast      Cutaneous abscess of left upper limb      Low back pain, unspecified      Cyst      Flank Pain      Back Pain, Cyst Left Breast      Back Pain      Lower Back Pain      Jun 11, 2021  [place] [facility]: [facility].  [place]  Emergency      Pain in left wrist      Unspecified abdominal pain      Hyperglycemia, unspecified      Wrist Pain      Emesis      vomiting, wrist pain        Recent Inpatient Visit  Summary  No Recent Inpatient Visits were found.  Care Team  No Care Team was found.  [facility]  This patient has registered at the [facility] Emergency Department  For more information visit: [email or web address]     PLEASE NOTE:     1.   Any care recommendations and other clinical information are provided as guidelines or for historical purposes only, and providers should exercise their own clinical judgment when providing care.    2.   You may only use this information for purposes of treatment, payment or health care operations activities, and subject to the limitations of applicable [facility] Policies.    3.   You should consult directly with the organization that provided a care guideline or other clinical history with any questions about additional information or accuracy or completeness of information provided.    ? 123456 [facility] - www.pointclickcare.com

## 2022-04-27 NOTE — ED Notes (Signed)
Gave report to [facility], RN

## 2022-04-27 NOTE — Discharge Instructions (Signed)
Dear [name]:    Thank you for choosing the [facility] Emergency Department, the premier emergency department in the [place] area.  I hope your visit today was EXCELLENT. You will receive a survey via text message that will give you the opportunity to provide feedback to your team about your visit. Please do not hesitate to reach out with any questions!    Specific instructions for your visit today:    Use inhaler as instructed  No smoking  Follow-up with primary care physician couple of days for reevaluation.  Return if worsening cough difficulty breathing or if any other concerns or questions.    IF YOU DO NOT CONTINUE TO IMPROVE OR YOUR CONDITION WORSENS, PLEASE CONTACT YOUR DOCTOR OR RETURN IMMEDIATELY TO THE EMERGENCY DEPARTMENT.    Sincerely,  Royden Purl, MD  Attending Emergency Physician  [facility] Emergency Department      OBTAINING A PRIMARY CARE APPOINTMENT    Primary care physicians (PCPs, also known as primary care doctors) are either internists or family medicine doctors. Both types of PCPs focus on health promotion, disease prevention, patient education and counseling, and treatment of acute and chronic medical conditions.    If you need a primary care doctor, please call the below number and ask who is receiving new patients.     [facility]  Telephone:  [phone]  [email or web address]    DOCTOR REFERRALS  Call [phone] (available 24 hours a day, 7 days a week) if you need any further referrals and we can help you find a primary care doctor or specialist.  Also, available online at:  [email or web address]    YOUR CONTACT INFORMATION  Before leaving please check with registration to make sure we have an up-to-date contact number.  You can call registration at [phone] to update your information.  For questions about your hospital bill, please call [phone].  For questions about your Emergency Dept Physician bill please call  [phone].      [name]  If you need help with health or social services, please call 2-1-1 for a free referral to resources in your area.  2-1-1 is a free service connecting people with information on health insurance, free clinics, pregnancy, mental health, dental care, food assistance, housing, and substance abuse counseling.  Also, available online at:  http://www.211[place].org    ORTHOPEDIC INJURY   Please know that significant injuries can exist even when an initial x-ray is read as normal or negative.  This can occur because some fractures (broken bones) are not initially visible on x-rays.  For this reason, close outpatient follow-up with your primary care doctor or bone specialist (orthopedist) is required.    MEDICATIONS AND FOLLOWUP  Please be aware that some prescription medications can cause drowsiness.  Use caution when driving or operating machinery.    The examination and treatment you have received in our Emergency Department is provided on an emergency basis, and is not intended to be a substitute for your primary care physician.  It is important that your doctor checks you again and that you report any new or remaining problems at that time.      ASSISTANCE WITH INSURANCE    Affordable Care Act  [facility])  Call to start or finish an application, compare plans, enroll or ask a question.  [place]: [phone]  Web:  Healthcare.gov    Help Enrolling in [place]  ([phone] (TOLL-FREE)  9364518954 (TTY)  Web:  Http://www.coverva.org    Local Help Enrolling in the [place]  3250897713 (MAIN)  Email:  health-help_0 .org  Web:  http://lewis-perez.info/  Address:  [address], [address] [place], [place] [address]    SEDATING MEDICATIONS  Sedating medications include strong pain medications (e.g. narcotics), muscle relaxers, benzodiazepines (used for anxiety and as muscle relaxers), Benadryl/diphenhydramine and other  antihistamines for allergic reactions/itching, and other medications.  If you are unsure if you have received a sedating medication, please ask your physician or nurse.  If you received a sedating medication: DO NOT drive a car. DO NOT operate machinery. DO NOT perform jobs where you need to be alert.  DO NOT drink alcoholic beverages while taking this medicine.     If you get dizzy, sit or lie down at the first signs. Be careful going up and down stairs.  Be extra careful to prevent falls.     Never give this medicine to others.     Keep this medicine out of reach of children.     Do not take or save old medicines. Throw them away when outdated.     Keep all medicines in a cool, dry place. DO NOT keep them in your bathroom medicine cabinet or in a cabinet above the stove.    MEDICATION REFILLS  Please be aware that we cannot refill any prescriptions through the ER. If you need further treatment from what is provided at your ER visit, please follow up with your primary care doctor or your pain management specialist.    [place]  Did you know [facility] has two freestanding ERs located just a few miles away?  [place] [facility] [place] [facility] [place] have short wait times, easy free parking directly in front of the building and top patient satisfaction scores - and the same Board Certified Emergency Medicine doctors as [facility].

## 2022-04-27 NOTE — ED Provider Notes (Signed)
History     Chief Complaint   Patient presents with    Chest Pain    Shortness of Breath    Abscess     Pt c/o  sob and chest discomfort for the past couple of days  No recent travel no sick contacts.  Evaluated at [facility] earlier today.  Sent here for further assessment.  Labs showed elevated D-dimer.  Patient with history of tobacco use.  Daily marijuana use.  Social alcohol use.  No history of asthma however notes that she has required an inhaler every time she falls sick with a cold.  Past medical history: Diabetes, hypertension  Social history: Positive tobacco alcohol and marijuana use    The history is provided by the patient. No language interpreter was used.   Chest Pain  Associated symptoms: shortness of breath    Shortness of Breath  Associated symptoms: chest pain    Abscess       Past Medical History:   Diagnosis Date    Diabetes mellitus     Hypertension        History reviewed. No pertinent surgical history.    History reviewed. No pertinent family history.    Social  Social History     Tobacco Use    Smoking status: Every Day     Packs/day: .5     Types: Cigarettes   Vaping Use    Vaping Use: Never used   Substance Use Topics    Alcohol use: Yes     Comment: Social    Drug use: Yes     Comment: MJ -daily       .     No Known Allergies    Home Medications       Med List Status: Complete Set By: Ramond Marrow, RN at 04/27/2022  9:45 PM              cyclobenzaprine (FLEXERIL) 10 MG tablet     Take 1 tablet (10 mg) by mouth 3 (three) times daily with meals for 10 days     dicyclomine (BENTYL) 10 MG capsule     Take 1 capsule (10 mg) by mouth every 6 (six) hours as needed (pain)     furosemide (LASIX) 20 MG tablet     Take 1 tablet (20 mg) by mouth 2 (two) times daily     metFORMIN (GLUCOPHAGE) 1000 MG tablet     Take 1 tablet (1,000 mg) by mouth     naproxen (NAPROSYN) 500 MG tablet     Take 1 tablet (500 mg) by mouth 2 (two) times daily with meals     naproxen (NAPROSYN) 500 MG tablet      Take 1 tablet (500 mg) by mouth 2 (two) times daily with meals     ondansetron (ZOFRAN-ODT) 4 MG disintegrating tablet     Take 1 tablet (4 mg) by mouth every 6 (six) hours as needed for Nausea             Review of Systems   Respiratory:  Positive for shortness of breath.    Cardiovascular:  Positive for chest pain.   All other systems reviewed and are negative.      Physical Exam    BP: 132/89, Heart Rate: (!) 118, Temp: 97.1 F (36.2 C), Resp Rate: 22, SpO2: 100 %, Weight: (!) 193 kg    Physical Exam  Vitals and nursing note reviewed.   Constitutional:  General: She is not in acute distress.     Appearance: She is well-developed. She is not diaphoretic.   HENT:      Head: Normocephalic and atraumatic.      Right Ear: External ear normal.      Left Ear: External ear normal.      Nose: Nose normal.   Eyes:      General:         Right eye: No discharge.         Left eye: No discharge.      Conjunctiva/sclera: Conjunctivae normal.      Pupils: Pupils are equal, round, and reactive to light.   Neck:      Trachea: Phonation normal. No tracheal deviation.   Cardiovascular:      Rate and Rhythm: Normal rate and regular rhythm.      Pulses:           Radial pulses are 2+ on the right side and 2+ on the left side.      Heart sounds: Normal heart sounds.   Pulmonary:      Effort: Pulmonary effort is normal. No respiratory distress.      Breath sounds: No decreased breath sounds.      Comments: Wheezing but harsh sounding breath sounds bilaterally  Musculoskeletal:         General: Normal range of motion.      Cervical back: Normal range of motion and neck supple.   Skin:     General: Skin is warm and dry.   Neurological:      Mental Status: She is alert and oriented to person, place, and time.      GCS: GCS eye subscore is 4. GCS verbal subscore is 5. GCS motor subscore is 6.      Cranial Nerves: No cranial nerve deficit.   Psychiatric:         Speech: Speech normal.           MDM and ED Course     ED Medication  Orders (From admission, onward)      Start Ordered     Status Ordering Provider    04/27/22 2203 04/27/22 2202  albuterol-ipratropium (DUO-NEB) 2.5-0.5(3) mg/3 mL nebulizer 3 mL  RT - Once        Route: Nebulization  Ordered Dose: 3 mL       Last MAR action: Given Himani Corona    04/27/22 2155 04/27/22 2155  iohexol (OMNIPAQUE) 350 MG/ML injection 100 mL  IMG once as needed        Route: Intravenous  Ordered Dose: 100 mL       Last MAR action: Imaging Agent Given HASAN, ALI A               Medical Decision Making  Reviewed labs and vitals.  Elevated D-dimer noted.  CTA negative for PE or pneumonia.  Received a breathing treatment in ER with improvement of symptoms.  Will treat clinically for acute bronchitis with inhaler.  Strict return precautions given.  Patient understands and is comfortable with plan.    Amount and/or Complexity of Data Reviewed  Labs:  Decision-making details documented in ED Course.  Radiology: ordered. Decision-making details documented in ED Course.    Risk  Prescription drug management.                     Procedures    Clinical Impression & Disposition     Clinical  Impression  Final diagnoses:   Acute bronchitis, unspecified organism        ED Disposition       ED Disposition   Discharge    Condition   --    Date/Time   Mon Apr 27, 2022 11:30 PM    Comment   Susan Olsen discharge to home/self care.    Condition at disposition: Stable                  New Prescriptions    ALBUTEROL SULFATE HFA (PROVENTIL) 108 (90 BASE) MCG/ACT INHALER    Inhale 2 puffs into the lungs every 4 (four) hours as needed for Wheezing or Shortness of Breath Dispense with spacer                   Governor Specking, PA  04/27/22 2337

## 2022-04-27 NOTE — ED Provider Notes (Signed)
 [facility] EMERGENCY DEPARTMENT  ATTENDING PHYSICIAN ATTESTATION NOTE     Patient Name: Susan Olsen, Susan Olsen  Encounter Date:  04/27/2022  Attending Physician: Susan Narang A. Haywood Pao, MD   Room:  [facility]  Patient DOB:  22-Jun-1990  Age: 32 y.o. female  MRN:  [id number]  PCP: Susan Lager, MD         Diagnosis/Disposition:     Final Impression  Final diagnoses:   Acute bronchitis, unspecified organism     Disposition  ED Disposition       ED Disposition   Discharge    Condition   --    Date/Time   Mon Apr 27, 2022 11:30 PM    Comment   Susan Olsen discharge to home/self care.    Condition at disposition: Stable               Follow up  [facility]  [address]  [place] [address]  [phone]  In 2 days      Prescriptions  Discharge Medication List as of 04/27/2022 11:35 PM        START taking these medications    Details   albuterol sulfate HFA (PROVENTIL) 108 (90 Base) MCG/ACT inhaler Inhale 2 puffs into the lungs every 4 (four) hours as needed for Wheezing or Shortness of Breath Dispense with spacer, Starting Mon 04/27/2022, Until Wed 05/27/2022 at 2359, E-Rx                 MDM:        Nursing Triage note: Pt transfer from [facility] for  CTA, see call in note. Pt also endorsing boil L chest and under L arm with subjective fevers, states ECC did not look at either. Concerned for infection.    Initial Assessment and Differential Diagnosis:  Susan Olsen is a 32 y.o. female with PMH significant for T2DM and HTN presents as a transfer from [facility] city with concerns of left anterior pleuritic chest pain and dyspnea in the setting of multiple URI symptoms including congestion and mild sore throat.  No exertional component symptoms noted.  D-dimer elevated at previous facility.  CTA unable to be obtained at [facility] earlier tonight due to patient's weight.  COVID/Influenza negative.  Patient being transferred to this facility for CTA.       Exam:  Constitutional: well appearing, No acute neurological defects, otherwise normal exam.  Heart: RRR  Lungs: CTAB, no respiratory distress  Abdomen: soft, non tender      Initial Differential Diagnosis:  Initial differential diagnosis to include but not limited to: PE, STEMI, NSTEMI, musculoskeletal chest wall pain, pneumonia, dehydration, electrolyte abnormalities, anemia, dysrhythmia    Plan:  CTA of the chest    Final Impression:  CTA with no evidence of PE.  Reviewed labs from OSH, likely patient with bronchitis.  Will discharge with albuterol inhaler.   EKG with no sign of acute ischemic change or dysrhythmia. Troponin negative. CT with no signs of pneumonia, pneumothorax, pleural effusion. Low concern for CHF exacerbation or decompensated cardiomyopathy.  Labs with no anemia. No acute metabolic abnormalities noted. HEART score 1, low concern for ACS. No clinical concern for aortic dissection, myocarditis, pericarditis, pericardial effusion. No concern for referred intra-abdominal etiology. BP stable, no concern for hypertensive urgency/emergency. Return precautions given, PCP f/u, advised, all questions answered.     The patient was deemed stable for discharge. They were given strict return precautions as it relates to  their presumed diagnosis, verbalized understanding of these precautions and agreed to follow up as instructed. All questions were answered prior to discharge.    Midlevel Provider Attestation:   The patient was seen and examined by the mid-level (resident, physician assistant or nurse practitioner) and the plan of care was discussed with me. I personally saw the patient and made/approved the management plan and take responsibility for the patient management.  Please see the separately documented midlevel note for additional information including but not limited to full history of present illness, review of systems and comprehensive physical exam.         Medical Decision Making  Problems  Addressed:  Acute bronchitis, unspecified organism: acute illness or injury    Risk  Prescription drug management.      Records Reviewed (internal and external)? : recent external ER records including visits for similar earlier tonight at [facility] city  Was management discussed with a consultant? : N/A  Diagnostic test considered and not performed : N/A  Prescription medications considered and not given : N/A  Hospitalization considered but not done : The patient did not meet criteria for admission.  Was the decision around the need for surgery discussed with a consultant? : N/A  Social Determinants of Health Considerations : N/A  Was there decision to not resuscitate or to de-escalate care due to poor prognosis? : N/A      All laboratory and imaging results discussed in detail. All potential differential diagnoses discussed, rationale for treatment plan and medical decision making discussed. Home care instructions advised in detail. Return precautions thoroughly explained. Close follow up with PCP and appropriate specialist/consultants advised and arranged if needed. Joint discharge with RN done, all questions answered and pt notes understanding to plan of care.          Interpretations, Clinical Decision Tools and Critical Care:       O2 Sat:  The patient's oxygen saturation was 100 % on room air. This was independently interpreted by me as Normal.     Cardiac Monitoring: I independely reviewed and interpreted the patient's rhythm strip as normal sinus at 99.           Procedures:   Procedures        ATTESTATIONS     Scribe Attestation:     I was acting as a Neurosurgeon for Susan Rasmussen, MD on Susan Olsen  Treatment Team: Scribe: Susan Olsen    I am the first provider for this patient and I personally performed the services documented. Treatment Team: Scribe: Susan Olsen, Susan Olsen is scribing for me on Susan Olsen,Susan Olsen. This note accurately reflects work and decisions made by me.  Susan Rasmussen, MD      Documentation Notes:  Parts of this note were generated by the Epic EMR system/ Dragon speech recognition and may contain inherent errors or omissions not intended by the user. Grammatical errors, random word insertions, deletions, pronoun errors and incomplete sentences are occasional consequences of this technology due to software limitations. Not all errors are caught or corrected.  My documentation is often completed after the patient is no longer under my clinical care. In some cases, the Epic EMR may pull updated results into the above documentation which may not reflect all results or information that were available to me at the time of my medical decision making.   If there are questions or concerns about the content of this note or information contained within the body of this  dictation they should be addressed directly with the author for clarification.    Midlevel Provider Attestation:   The patient was seen and examined by the mid-level (resident, physician assistant or nurse practitioner) and the plan of care was discussed with me. I agree with the plan as it was presented to me.  I have reviewed and agree with the final ED diagnosis.   Please see the separately documented midlevel note for additional information including but not limited to full history of present illness, review of systems and comprehensive physical exam.                      Susan Rasmussen, MD  04/29/22 2150

## 2022-04-27 NOTE — ED Provider Notes (Signed)
[place]  ATTENDING PHYSICIAN HISTORY AND PHYSICAL EXAM     Patient Name: Susan Olsen, Susan Olsen  Department:[facility] [place]  Encounter Date:  04/27/2022  Attending Physician: Rinaldo Ratel, MD   Age: 32 y.o. female  Patient Room: 8/M 8  PCP: Junius Roads, MD           Diagnosis/Disposition:     Final diagnoses:   Chest pain, unspecified type   Positive D dimer       ED Disposition       ED Disposition   Transfer to Another Facility    Condition   --    Date/Time   Mon Apr 27, 2022  6:32 PM    Comment   Susan Olsen should be transferred out to [place].                 Follow-Up Providers (if applicable)    No follow-up provider specified.     Discharge Medication List as of 04/27/2022  6:33 PM              Medical Decision Making:     Initial Differential Diagnosis:  Initial differential diagnosis to include but not limited to: pulmonary embolism, acute coronary syndrome, muscular pain, costochondritis    Plan:  Labs  Covid  EKG    Final Impression:  Patient here with chief complaint of chest pain and shortness of breath with inspiration. Also reports sxs of congestion and mild sore throat. EKG baseline without acute ischemia. Heart score 3. No exertional chest pain/shortness of breath. Does have pleuritic component though atypical, low wells score, however dimer positive. Covid/flu negative. Will obtain CTA, hold xray  Due to weight unable to get CTA [facility]. Will to be transferred for cta. Did have improvement with toradol. No wheezing. Transfer to [facility] via P[facility]. Declined ambulance transfer   The patient was transferred to another facility for further management of their care. We discussed all aspects of the work-up completed in the ED, any pending studies/therapies, and all clinical decision making at the time care was handed off to the next provider. Appropriate EMTALA and transfer paperwork was completed and can be found in the patient's medical record.           Medical Decision Making  Amount and/or Complexity of Data Reviewed  Labs: ordered.    Risk  Prescription drug management.      Records Reviewed (internal and external)? : prior lab results reviewed for comparison  Was management discussed with a consultant? : N/A  Diagnostic test considered and not performed : N/A  Prescription medications considered and not given : Antibiotics considered and not given as symptoms felt to be viral in etiology  Hospitalization considered but not done : N/A  Was the decision around the need for surgery discussed with a consultant? : N/A  Social Determinants of Health Considerations : N/A  Was there decision to not resuscitate or to de-escalate care due to poor prognosis? : N/A        History of Presenting Illness:     Nursing Triage note: Pt ambulated to bed; c/o midsternal CP & SOB since last night w/ sore throat. Also states cyst under L breast & L arm. States two cysts were under her arm but one opened up and now it smells. No cardiac or asthma hx. Does have hx of cysts.  Chief complaint: Chest Pain and Shortness of Breath    Susan Olsen is a  32 y.o. female with hx of DM reporting chest pain, shortness of breath, pain with inspiration. Pain constant. None exertional none radiating. Also feels fatigue. On ROS reports some congestion, sore throat. No abd pain, dizziness, focal weakness, cough, fever. No other sxs    Patient did not inform me about cyst, main complaint is chest pain/shortness of breath.     No hx of asthma. But dx in the past with bronchitis          Review of Systems:  Physical Exam:     Review of Systems    Positive and negative ROS per above and in HPI. All other systems reviewed and negative.     Pulse 93  BP 143/84  Resp 18  SpO2 100 %  Temp 97.1 F (36.2 C)     Physical Exam      General: Comfortable, NAD.  Head: Atraumatic, normocephalic.  Eyes: No conjunctival injection. No discharge. EOMI. PERRL.  ENT: phonating well   Neck: Supple,  normal range of motion.  Respiratory/Chest: No respiratory distress. CTAB  Cardiovascular: RRR. No m/g/r. B/l RP & DP +2  Abdomen: NBS. Soft. Non-tender. No guarding/rebound. No distension.   Back: No spinous process or paravertebral tenderness.  Lower Extremity:  No calf tenderness. Warm and well perfused  Neurological: Awake, alert, oriented x 3. Speech smooth. No facial asymmetry. Steady gait  Skin: Warm and dry. No rash.  Psychiatric: Normal affect. Normal insight.          Interpretations, Clinical Decision Tools and Critical Care:     O2 Sat:  The patient's oxygen saturation was 100 % on room air. This was independently interpreted by me as Normal.   EKG: I reviewed and Independently interpreted the patient's EKG as normal sinus at 69.  No STE or ST depression. Q in III no change from prior  Cardiac Monitoring: I independely reviewed and interpreted the patient's rhythm strip as normal sinus at 88.                 Procedures:   Procedures      Attestations:     Scribe Attestation: There was no scribe involved in the care of this patient.     Documentation Notes:  Parts of this note were generated by the Epic EMR system/ Dragon speech recognition and may contain inherent errors or omissions not intended by the user. Grammatical errors, random word insertions, deletions, pronoun errors and incomplete sentences are occasional consequences of this technology due to software limitations. Not all errors are caught or corrected.  My documentation is often completed after the patient is no longer under my clinical care. In some cases, the Epic EMR may pull updated results into the above documentation which may not reflect all results or information that were available to me at the time of my medical decision making.   If there are questions or concerns about the content of this note or information contained within the body of this dictation they should be addressed directly with the author for clarification.                   Rinaldo Ratel, MD  04/27/22 2114

## 2022-04-27 NOTE — EDIE (Signed)
[facility]?NOTIFICATION?04/27/2022 16:03?SHLONDA, ALLERY R?MRN: [id number]    Criteria Met      5 ED Visits in 12 Months    Security and Safety  No Security Events were found.  ED Care Guidelines  There are currently no ED Care Guidelines for this patient. Please check your facility's medical records system.        Prescription Monitoring Program  000 ??- Narcotic Use Score   000 ??- Sedative Use Score   000 ??- Stimulant Use Score   000??- Overdose Risk Score  - All Scores range from 000-999 with 75% of the population scoring < 200 and only 1% scoring above 650  - The last digit of the narcotic, sedative, and stimulant score indicates the number of active prescriptions of that type  - Higher Use scores correlate with increased prescribers, pharmacies, mg equiv, and overlapping prescriptions   - Higher Overdose Risk Scores correlate with increased risk of unintentional overdose death   Concerning or unexpectedly high scores should prompt a review of the PMP record; this does not constitute checking PMP for prescribing purposes.    E.D. Visit Count (12 mo.)  Facility Visits   [facility]: [facility] 1   Total 5   Note: Visits indicate total known visits.     Recent Emergency Department Visit Summary  Date Facility [facility] Type Diagnoses or Chief Complaint    Apr 27, 2022  [facility] Emergency Room: [facility].  [place]  Emergency      chest pain sob,cyst under arm near sycope      Apr 20, 2022  [place] [facility]: [facility].  [place]  Emergency      Strain of muscle, fascia and tendon of lower back, initial encounter      Back Pain      Back Pain;      Feb 14, 2022  [place].  [place].  [place]  Emergency      Calcific tendinitis, unspecified site      Pain in right shoulder      Arm Pain      right arm pain      Sep 16, 2021  [place] [facility]: [facility].  [place]  Emergency      Solitary cyst of left breast      Cutaneous abscess of left upper limb      Low back pain,  unspecified      Cyst      Flank Pain      Back Pain, Cyst Left Breast      Back Pain      Lower Back Pain      Jun 11, 2021  [place] [facility]: [facility].  [place]  Emergency      Pain in left wrist      Unspecified abdominal pain      Hyperglycemia, unspecified      Wrist Pain      Emesis      vomiting, wrist pain        Recent Inpatient Visit Summary  No Recent Inpatient Visits were found.  Care Team  No Care Team was found.  [facility]  This patient has registered at the [place] [facility]: [facility] Emergency Department  For more information visit: [email or web address]     PLEASE NOTE:     1.   Any care recommendations and other clinical information are provided as guidelines or for historical  purposes only, and providers should exercise their own clinical judgment when providing care.    2.   You may only use this information for purposes of treatment, payment or health care operations activities, and subject to the limitations of applicable [facility] Policies.    3.   You should consult directly with the organization that provided a care guideline or other clinical history with any questions about additional information or accuracy or completeness of information provided.    ? [id number] [facility] - www.[facility].com

## 2022-04-27 NOTE — ED Triage Notes (Signed)
Came in for chest pain left sided with SOB started last night,sent here from [place] for CT scan. also with boil on the left underarm that has discharge, with episodes of fever.

## 2022-04-28 LAB — ECG 12-LEAD
Atrial Rate: 98 {beats}/min
IHS MUSE NARRATIVE AND IMPRESSION: NORMAL
P Axis: 45 degrees
P-R Interval: 134 ms
Q-T Interval: 330 ms
QRS Duration: 82 ms
QTC Calculation (Bezet): 421 ms
R Axis: 50 degrees
T Axis: 118 degrees
Ventricular Rate: 98 {beats}/min

## 2023-07-09 ENCOUNTER — Emergency Department
Admission: EM | Admit: 2023-07-09 | Discharge: 2023-07-09 | Disposition: A | Discharge status: Home / Self Care | Attending: Emergency Medicine | Admitting: Emergency Medicine

## 2023-07-09 DIAGNOSIS — S39012A Strain of muscle, fascia and tendon of lower back, initial encounter: Secondary | ICD-10-CM | POA: Insufficient documentation

## 2023-07-09 DIAGNOSIS — S161XXA Strain of muscle, fascia and tendon at neck level, initial encounter: Secondary | ICD-10-CM | POA: Insufficient documentation

## 2023-07-09 DIAGNOSIS — Y92410 Unspecified street and highway as the place of occurrence of the external cause: Secondary | ICD-10-CM | POA: Insufficient documentation

## 2023-07-09 DIAGNOSIS — F17210 Nicotine dependence, cigarettes, uncomplicated: Secondary | ICD-10-CM | POA: Insufficient documentation

## 2023-07-09 MED ORDER — CYCLOBENZAPRINE HCL 10 MG PO TABS
10.0000 mg | ORAL_TABLET | Freq: Three times a day (TID) | ORAL | 0 refills | Status: AC | PRN
Start: 2023-07-09 — End: ?

## 2023-07-09 MED ORDER — IBUPROFEN 600 MG PO TABS
600.0000 mg | ORAL_TABLET | Freq: Once | ORAL | Status: AC
Start: 2023-07-09 — End: 2023-07-09
  Administered 2023-07-09: 600 mg via ORAL
  Filled 2023-07-09: qty 1

## 2023-07-09 MED ORDER — IBUPROFEN 600 MG PO TABS
600.0000 mg | ORAL_TABLET | Freq: Four times a day (QID) | ORAL | 0 refills | Status: AC | PRN
Start: 2023-07-09 — End: ?
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
